# Patient Record
Sex: Female | Born: 1965 | Race: Black or African American | Hispanic: No | Marital: Single | Smoking: Current every day smoker
Health system: Southern US, Community
[De-identification: ages and names within clinical notes are randomized; demographics above are authoritative.]

## PROBLEM LIST (undated history)

## (undated) DIAGNOSIS — M545 Low back pain, unspecified: Secondary | ICD-10-CM

## (undated) DIAGNOSIS — I10 Essential (primary) hypertension: Secondary | ICD-10-CM

## (undated) DIAGNOSIS — F329 Major depressive disorder, single episode, unspecified: Secondary | ICD-10-CM

## (undated) DIAGNOSIS — F32A Depression, unspecified: Secondary | ICD-10-CM

## (undated) DIAGNOSIS — G5600 Carpal tunnel syndrome, unspecified upper limb: Secondary | ICD-10-CM

## (undated) HISTORY — DX: Low back pain, unspecified: M54.50

## (undated) HISTORY — DX: Major depressive disorder, single episode, unspecified: F32.9

## (undated) HISTORY — PX: KNEE SURGERY: SHX244

## (undated) HISTORY — PX: BREAST BIOPSY: SHX20

## (undated) HISTORY — PX: OTHER SURGICAL HISTORY: SHX169

## (undated) HISTORY — DX: Depression, unspecified: F32.A

## (undated) HISTORY — DX: Low back pain: M54.5

## (undated) HISTORY — DX: Carpal tunnel syndrome, unspecified upper limb: G56.00

---

## 2005-02-06 HISTORY — PX: ABDOMINAL HYSTERECTOMY: SHX81

## 2013-02-21 ENCOUNTER — Emergency Department (HOSPITAL_BASED_OUTPATIENT_CLINIC_OR_DEPARTMENT_OTHER)
Admission: EM | Admit: 2013-02-21 | Discharge: 2013-02-21 | Disposition: A | Discharge status: Home / Self Care | Payer: Medicaid Other | Attending: Emergency Medicine | Admitting: Emergency Medicine

## 2013-02-21 ENCOUNTER — Encounter (HOSPITAL_BASED_OUTPATIENT_CLINIC_OR_DEPARTMENT_OTHER): Payer: Self-pay | Admitting: Emergency Medicine

## 2013-02-21 ENCOUNTER — Emergency Department (HOSPITAL_BASED_OUTPATIENT_CLINIC_OR_DEPARTMENT_OTHER): Payer: Medicaid Other

## 2013-02-21 DIAGNOSIS — R079 Chest pain, unspecified: Secondary | ICD-10-CM

## 2013-02-21 DIAGNOSIS — I10 Essential (primary) hypertension: Secondary | ICD-10-CM | POA: Insufficient documentation

## 2013-02-21 DIAGNOSIS — R1110 Vomiting, unspecified: Secondary | ICD-10-CM | POA: Insufficient documentation

## 2013-02-21 DIAGNOSIS — R42 Dizziness and giddiness: Secondary | ICD-10-CM | POA: Insufficient documentation

## 2013-02-21 DIAGNOSIS — R0789 Other chest pain: Secondary | ICD-10-CM | POA: Insufficient documentation

## 2013-02-21 DIAGNOSIS — F17200 Nicotine dependence, unspecified, uncomplicated: Secondary | ICD-10-CM | POA: Insufficient documentation

## 2013-02-21 HISTORY — DX: Essential (primary) hypertension: I10

## 2013-02-21 LAB — CBC WITH DIFFERENTIAL/PLATELET
BASOS ABS: 0 10*3/uL (ref 0.0–0.1)
Basophils Relative: 0 % (ref 0–1)
Eosinophils Absolute: 0.2 10*3/uL (ref 0.0–0.7)
Eosinophils Relative: 2 % (ref 0–5)
HEMATOCRIT: 41.6 % (ref 36.0–46.0)
Hemoglobin: 14.6 g/dL (ref 12.0–15.0)
LYMPHS ABS: 4.1 10*3/uL — AB (ref 0.7–4.0)
Lymphocytes Relative: 42 % (ref 12–46)
MCH: 29.5 pg (ref 26.0–34.0)
MCHC: 35.1 g/dL (ref 30.0–36.0)
MCV: 84 fL (ref 78.0–100.0)
Monocytes Absolute: 0.8 10*3/uL (ref 0.1–1.0)
Monocytes Relative: 9 % (ref 3–12)
NEUTROS ABS: 4.6 10*3/uL (ref 1.7–7.7)
Neutrophils Relative %: 47 % (ref 43–77)
PLATELETS: 280 10*3/uL (ref 150–400)
RBC: 4.95 MIL/uL (ref 3.87–5.11)
RDW: 15.2 % (ref 11.5–15.5)
WBC: 9.7 10*3/uL (ref 4.0–10.5)

## 2013-02-21 LAB — BASIC METABOLIC PANEL
BUN: 6 mg/dL (ref 6–23)
CHLORIDE: 105 meq/L (ref 96–112)
CO2: 24 meq/L (ref 19–32)
Calcium: 9.1 mg/dL (ref 8.4–10.5)
Creatinine, Ser: 0.9 mg/dL (ref 0.50–1.10)
GFR calc Af Amer: 86 mL/min — ABNORMAL LOW (ref >90)
GFR calc non Af Amer: 74 mL/min — ABNORMAL LOW (ref >90)
GLUCOSE: 102 mg/dL — AB (ref 70–99)
Potassium: 3.3 mEq/L — ABNORMAL LOW (ref 3.7–5.3)
SODIUM: 142 meq/L (ref 137–147)

## 2013-02-21 LAB — TROPONIN I

## 2013-02-21 MED ORDER — LISINOPRIL 20 MG PO TABS: 20.0000 mg | ORAL_TABLET | Freq: Every day | ORAL | Status: DC

## 2013-02-21 MED ORDER — HYDROCHLOROTHIAZIDE 25 MG PO TABS: 25.0000 mg | ORAL_TABLET | Freq: Every day | ORAL | Status: DC

## 2013-02-21 NOTE — ED Notes (Signed)
Headache, dizzy, nausea, vomiting. Pain in her chest into her left arm x 3 days.

## 2013-02-21 NOTE — Discharge Instructions (Signed)
YOu need to find a primary care doctor! Emergency Department Resource Guide 1) Find a Doctor and Pay Out of Pocket Although you won't have to find out who is covered by your insurance plan, it is a good idea to ask around and get recommendations. You will then need to call the office and see if the doctor you have chosen will accept you as a new patient and what types of options they offer for patients who are self-pay. Some doctors offer discounts or will set up payment plans for their patients who do not have insurance, but you will need to ask so you aren't surprised when you get to your appointment.  2) Contact Your Local Health Department Not all health departments have doctors that can see patients for sick visits, but many do, so it is worth a call to see if yours does. If you don't know where your local health department is, you can check in your phone book. The CDC also has a tool to help you locate your state's health department, and many state websites also have listings of all of their local health departments.  3) Find a Walk-in Clinic If your illness is not likely to be very severe or complicated, you may want to try a walk in clinic. These are popping up all over the country in pharmacies, drugstores, and shopping centers. They're usually staffed by nurse practitioners or physician assistants that have been trained to treat common illnesses and complaints. They're usually fairly quick and inexpensive. However, if you have serious medical issues or chronic medical problems, these are probably not your best option.  No Primary Care Doctor: - Call Health Connect at  [phone] - they can help you locate a primary care doctor that  accepts your insurance, provides certain services, etc. - Physician Referral Service- [phone]  Chronic Pain Problems: Organization         Address  Phone   Notes  [facility]  [phone] Patients need to be referred by their primary  care doctor.   Medication Assistance: Organization         Address  Phone   Notes  [facility] Medication [facility] [address] [place], [place] [address] [phone] --Must be a resident of [facility] -- Must have NO insurance coverage whatsoever (no Medicaid/ Medicare, etc.) -- The pt. MUST have a primary care doctor that directs their care regularly and follows them in the community   Med[facility]ist  [phone]   [employer]  [phone]    Agencies that provide inexpensive medical care: Organization         Address  Phone   Notes  [facility]  [phone]   [facility]    [phone]   [facility] [address] [place], [place] [address] [phone]   [facility] [address], [place] [phone]   Planned Parenthood    [phone]   [facility]    [phone]   [facility]  [address], [place] Phone:  [phone], Fax:  [phone] Hours of Operation:  9 am - 6 pm, M-F.  Also accepts Medicaid/Medicare and self-pay.  [facility]  [address], [place] Phone: [phone], Fax: [phone]. Hours of Operation:  8:30 am - 5:30 pm, M-F.  Also accepts  Medicaid and self-pay.  [facility] [place] [address], [place] Phone: [phone]   [facility] [address] Natasha Bence [place], [place] [phone], Ext. [phone] Mondays & Thursdays: 7-9 AM.  First 15 patients are seen on a first come, first serve basis.    Medicaid-accepting [facility] Providers:  Organization         Address  Phone   Notes  [facility] [address], [place] [phone] Also accepts self-pay patients.  [facility] [address] Laurell Josephs [place], [place]  [phone]   [facility] [address], [place] [phone]   [facility] [address] [place] [phone]   Renaye Rakers [address], [place]   [phone] Only accepts [place] patients after they have their name applied to their card.   Self-Pay (no insurance) in [facility]:  Organization         Address  Phone   Notes  Sickle Cell Patients, [facility] [address] [place], [place] [phone]   [facility] [address] [place], [place] [phone]   [facility]  [address], [place] [phone]   [facility]/Dr. Osei-Bonsu  [address], [place] or [address], [place] [phone] Phone number for both [place] and [place] locations is the same.  [facility] [address], [place] [phone]   [facility] [address], [place] or [address] [phone]   [facility] [address], [place] [phone], phone; [phone], fax Sees patients 1st and 3rd Saturday of every month.  Must not qualify for public or private insurance (i.e. Medicaid, Medicare, [place] Health Choice, Veterans' Benefits)  Household income should be no more than 200% of the poverty level The clinic cannot treat you if you are pregnant or think you are pregnant  Sexually transmitted diseases are not treated at the clinic.    Dental Care: Organization         Address  Phone  Notes  [facility] [address] [place], [place] [phone] Accepts children up to age 57 who are enrolled in [place] or [place] Health Choice; pregnant women with a Medicaid card; and children who have applied for Medicaid or [place] Health Choice, but were declined, whose parents can pay a reduced fee at  time of service.  [facility] Department of [facility]  [address], [place] [phone] Accepts children up to age 32 who are enrolled in [place] or [place] Health Choice; pregnant women with a Medicaid card; and children who have applied for Medicaid or [place] Health Choice, but were declined, whose parents can pay a reduced fee at time of service.  [facility]  [address] [place], [place] [phone] Patients are seen by appointment only. Walk-ins are not accepted. [facility] will see patients 65 years of age and older. Monday - Tuesday (8am-5pm) Most Wednesdays (8:30-5pm) $30 per visit, cash only  [facility] Adult [facility]  [address], [facility] [phone] Patients are seen by appointment only. Walk-ins are not accepted. [facility] will see  patients 50 years of age and older. One Wednesday Evening (Monthly: Volunteer Based).  $30 per visit, cash only  [employer] [facility]  [phone] for adults; Children under age 404, call Graduate Pediatric Dentistry at [phone]. Children aged 274-14, please call [phone] to request a pediatric application.  Dental services are provided in all areas of dental care including fillings, crowns and bridges, complete and partial dentures, implants, gum treatment, root canals, and extractions. Preventive care is also provided. Treatment is provided to both adults and children. Patients are selected via a lottery and there is often a waiting list.   [facility] [address], [place]  [phone] [email or web address]   [facility] [address], [place], [place] [phone], Ext. [phone] Second and Fourth Thursday of each month, opens at 6:30 AM; Clinic ends at 9 AM.  Patients are seen on a first-come first-served basis, and a limited number are seen during each clinic.   [facility]  [address], [place], [place] [phone]) [phone]   Eligibility Requirements You must have lived in [place], [place], or [place] counties for at least the last three months.   You cannot be eligible for state or federal sponsored National Cityhealthcare insurance, including CIGNAVeterans Administration, [place]icaid, or [employer]icare.   You generally cannot be eligible for healthcare insurance through your employer.    How to apply: Eligibility screenings are held every Tuesday and Wednesday afternoon from 1:00 pm until 4:00 pm. You do not need an appointment for the interview!  [facility] [address], [place], [place] [phone]   [facility]  [phone]   [facility]  [phone]   [facility]  [phone]    Behavioral Health Resources in the Community: Intensive Outpatient Programs Organization         Address  Phone  Notes  [facility] [address], [place], [place] [phone]   [facility] [address], [place], [place] [phone]   [facility] [address], [place], [place]  [phone]   [facility] [address],  [place], [place] [phone] or [phone]   Substance Abuse Resources Organization         Address  Phone  Notes  [facility]  [phone]   [facility]  [phone]   [facility]  [phone]   [facility]  [phone]   Residential & Outpatient Substance Abuse Program  [phone]   Psychological Services Organization         Address  Phone  Notes  [facility]  [phone]- [phone]   [facility]  [phone]- [phone]   [facility] [address], [place] [phone] or [phone]    Mobile Crisis Teams Organization         Address  Phone  Notes  [facility], Mobile Crisis Care Unit  [phone]   [facility]  [facility]  [address] [place], [place] [phone]   Doristine LocksSharon DeEsch [address] [place] [phone]    Self-Help/Support Groups Organization         Address  Phone             Notes  [facility] [place] - variety of support groups  [phone] Call for more information  Narcotics Anonymous (NA), [facility] [address], [place]  2 meetings at this location   Residential Treatment Programs Organization         Address  Phone  Notes  [facility] [address],    [place]  [phone]   [facility]  [address], [place] [phone], [place], [place] [phone]   [facility] [address], [place] [phone] Admissions: 8am-3pm M-F  [facility] [address]    [place], [place] [phone]   [facility] [address], [place], [place] [phone]   [facility] [address]  [place], [place] [phone]   [facility] - Intensive Outpatient [address], [place], [place] [phone]   [facility] ([facility] [address]  [place], [place] [phone] or [phone]   [facility] ([facility]) [address] [place], [place] [phone] Accepts Medicaid  [facility] [address]  [place] [phone] Substance Abuse/Addiction Treatment   [facility] Resources Organization         Address  Phone  Notes  [facility]  [phone]) [phone]   Angie FavaJulie Brannon, PhD [address], [address] [place], [place]   [phone] or [phone]   [facility]   [address] [place], [place] [phone]) [phone]   [facility] [address], [place], [place] [phone](336) [phone] Insurance/Medicaid/sponsorship through [facility]  [facility] [address]                                    [place], [place] [phone])  [phone] Therapy/tele-psych/case  [facility] [address]   [place], [place] [phone]    Dr. Lolly MustacheArfeen  [phone]   [facility]  [employer] [facility] 1) [address], [place] 2) [address], [place] 3)  [address], [place] [phone](336) [phone]  [phone]   [facility] Child Abuse Hotline [phone] or [phone](336) [phone] (After Hours)      Chest Pain (Nonspecific) Chest pain has many causes. Your pain could be caused by something serious, such as a heart attack or a blood clot in the lungs. It could also be caused by something less serious, such as a chest bruise or a virus. Follow up with your doctor. More lab tests or other studies may be needed to find the cause of your pain. Most of the time, nonspecific chest pain will improve within 2 to 3 days of rest and mild pain medicine. HOME CARE  For chest bruises, you may put ice on the sore area for 15-20 minutes, 03-04 times a day. Do this only if it makes you feel better.  Put ice in a plastic bag.  Place a towel between the skin and the bag.  Rest for the next 2 to 3 days.  Go back to work if the pain improves.  See your doctor if the pain lasts longer than 1 to 2 weeks.  Only take medicine as told by your doctor.  Quit smoking if you smoke. GET HELP RIGHT AWAY IF:   There is more pain or pain that spreads to the arm, neck, jaw, back, or belly (abdomen).  You have shortness of breath.  You cough more than usual or cough up blood.  You have  very bad back or belly pain, feel sick to your stomach (nauseous), or throw up (vomit).  You have very bad weakness.  You pass out (faint).  You have a fever. Any of these problems may be serious and may be an emergency. Do not wait to see if the problems will go away. Get medical help right away. Call your local emergency services 911 in [place].. Do not drive yourself to the hospital. MAKE SURE  YOU:   Understand these instructions.  Will watch this condition.  Will get help right away if you or your child is not doing well or gets worse. Document Released: 07/12/2007 Document Revised: 04/17/2011 Document Reviewed: 07/12/2007 [facility] Patient Information 2014 [place], [place].

## 2013-02-21 NOTE — ED Provider Notes (Signed)
CSN: [id number]     Arrival date & time 02/21/13  1707 History   First MD Initiated Contact with Patient 02/21/13 1723     Chief Complaint  Patient presents with  . Chest Pain   (Consider location/radiation/quality/duration/timing/severity/associated sxs/prior Treatment) HPI Comments: Pt states that she has had dizziness with the cp and she vomited 3 times today. Pt state that she is supposed to be on bp medication but she has not been taking it for a year. Cp free at this time  Patient is a 48 y.o. female presenting with chest pain. The history is provided by the patient. No language interpreter was used.  Chest Pain Pain location:  Substernal area Pain quality: sharp   Pain radiates to:  L arm Pain radiates to the back: no   Pain severity:  Mild Onset quality:  Gradual Duration:  1 week Timing:  Intermittent Progression:  Unchanged Context: stress   Relieved by:  Nothing Worsened by:  Deep breathing Ineffective treatments:  None tried Associated symptoms: dizziness   Associated symptoms: no fever     Past Medical History  Diagnosis Date  . Hypertension    Past Surgical History  Procedure Laterality Date  . Abdominal hysterectomy    . Knee surgery     No family history on file. History  Substance Use Topics  . Smoking status: Current Every Day Smoker -- 0.50 packs/day    Types: Cigarettes  . Smokeless tobacco: Not on file  . Alcohol Use: No   OB History   Grav Para Term Preterm Abortions TAB SAB Ect Mult Living                 Review of Systems  Constitutional: Negative for fever.  Respiratory: Negative.   Cardiovascular: Positive for chest pain.  Neurological: Positive for dizziness.    Allergies  Review of patient's allergies indicates no known allergies.  Home Medications  No current outpatient prescriptions on file. BP 172/99  Pulse 80  Temp(Src) 98.6 F (37 C) (Oral)  Resp 18  Ht 5\' 2"  (1.575 m)  Wt 185 lb (83.915 kg)  BMI 33.83 kg/m2  SpO2  99% Physical Exam  Nursing note and vitals reviewed. Constitutional: She is oriented to person, place, and time. She appears well-developed and well-nourished.  HENT:  Head: Normocephalic and atraumatic.  Cardiovascular: Normal rate and regular rhythm.   Pulmonary/Chest: Effort normal and breath sounds normal. She exhibits no tenderness.  Abdominal: Soft. Bowel sounds are normal.  Musculoskeletal: Normal range of motion.  Neurological: She is alert and oriented to person, place, and time.  Skin: Skin is warm and dry.  Psychiatric: She has a normal mood and affect.    ED Course  Procedures (including critical care time) Labs Review Labs Reviewed  CBC WITH DIFFERENTIAL - Abnormal; Notable for the following:    Lymphs Abs 4.1 (*)    All other components within normal limits  BASIC METABOLIC PANEL  TROPONIN I   Imaging Review Dg Chest 2 View  02/21/2013   CLINICAL DATA:  Chest pain  EXAM: CHEST  2 VIEW  COMPARISON:  None.  FINDINGS: The heart size and mediastinal contours are within normal limits. Both lungs are clear. The visualized skeletal structures are unremarkable.  IMPRESSION: No active cardiopulmonary disease.   Electronically Signed   By: Salome Holmes M.D.   On: 02/21/2013 17:55   Ct Head Wo Contrast  02/21/2013   CLINICAL DATA:  Dizziness and headache with history of hypertension  EXAM: CT HEAD WITHOUT CONTRAST  TECHNIQUE: Contiguous axial images were obtained from the base of the skull through the vertex without intravenous contrast.  COMPARISON:  None.  FINDINGS: The ventricles are normal in size and position. There is no intracranial hemorrhage nor intracranial mass effect. There is no evidence of an evolving ischemic infarction. The cerebellum and brainstem are normal in density.  At bone window settings the observed portions of the paranasal sinuses and mastoid air cells are clear. There is no evidence of an acute skull fracture.  IMPRESSION: There is no evidence of an  acute ischemic or hemorrhagic event or other acute intracranial abnormality.   Electronically Signed   By: David  [place]   On: 02/21/2013 17:54    EKG Interpretation    Date/Time:  Friday February 21 2013 17:17:28 EST Ventricular Rate:  74 PR Interval:  156 QRS Duration: 74 QT Interval:  420 QTC Calculation: 466 R Axis:   68 Text Interpretation:  Normal sinus rhythm Normal ECG Confirmed by BEATON  MD, ROBERT ([id number]) on 02/21/2013 5:20:39 PM            MDM   1. Chest pain   2. Hypertension    Pt has been having pain for a week;troponin is negative:think that she need to start bp medications again and given resource guide:pt tearful and has been under a lot of stress:so think anxiety a component of this   Teressa LowerVrinda Hagen Bohorquez, NP 02/21/13 1954

## 2013-02-26 NOTE — ED Provider Notes (Signed)
Medical screening examination/treatment/procedure(s) were performed by non-physician practitioner and as supervising physician I was immediately available for consultation/collaboration.    Samary Shatz L Delaine Hernandez, MD 02/26/13 0807 

## 2013-08-22 ENCOUNTER — Ambulatory Visit: Payer: Medicaid Other | Admitting: Family Medicine

## 2013-09-05 ENCOUNTER — Ambulatory Visit: Payer: Medicaid Other | Admitting: Family Medicine

## 2013-09-09 ENCOUNTER — Telehealth: Payer: Self-pay

## 2013-09-09 NOTE — Telephone Encounter (Signed)
New patient  [place]. Has copies of her records and plans to bring them with her during her office visit.     Medication and allergies:  Reviewed and updated  90 day supply/mail order: n/a Local pharmacy:  [id number] [place] - [address]OSS [address]   Immunizations due:  tdap   A/P: Personal, family history and past surgical hx: Reviewed and updated PAP- no longer receives since hysterectomy [facility]- 2012- cyst on L breast otw normal Flu- DUE Tdap- unsure of when she last received   To Discuss with Provider: Nothing at this time.

## 2013-09-10 ENCOUNTER — Telehealth: Payer: Self-pay | Admitting: Family Medicine

## 2013-09-10 ENCOUNTER — Encounter: Payer: Self-pay | Admitting: Family Medicine

## 2013-09-10 ENCOUNTER — Ambulatory Visit (INDEPENDENT_AMBULATORY_CARE_PROVIDER_SITE_OTHER): Payer: Medicaid Other | Admitting: Family Medicine

## 2013-09-10 VITALS — BP 160/98 | HR 72 | Temp 97.7°F | Ht 63.0 in | Wt 171.2 lb

## 2013-09-10 DIAGNOSIS — G8929 Other chronic pain: Secondary | ICD-10-CM

## 2013-09-10 DIAGNOSIS — R0789 Other chest pain: Secondary | ICD-10-CM

## 2013-09-10 DIAGNOSIS — I10 Essential (primary) hypertension: Secondary | ICD-10-CM

## 2013-09-10 DIAGNOSIS — N390 Urinary tract infection, site not specified: Secondary | ICD-10-CM

## 2013-09-10 DIAGNOSIS — M549 Dorsalgia, unspecified: Secondary | ICD-10-CM

## 2013-09-10 MED ORDER — NONFORMULARY OR COMPOUNDED ITEM: Status: DC

## 2013-09-10 MED ORDER — LISINOPRIL-HYDROCHLOROTHIAZIDE 20-25 MG PO TABS: 1.0000 | ORAL_TABLET | Freq: Every day | ORAL | Status: DC

## 2013-09-10 MED ORDER — TIZANIDINE HCL 4 MG PO CAPS: 4.0000 mg | ORAL_CAPSULE | Freq: Three times a day (TID) | ORAL | Status: DC | PRN

## 2013-09-10 MED ORDER — HYDROCODONE-ACETAMINOPHEN 5-325 MG PO TABS: 1.0000 | ORAL_TABLET | Freq: Four times a day (QID) | ORAL | Status: DC | PRN

## 2013-09-10 MED ORDER — TRAMADOL HCL 50 MG PO TABS: 50.0000 mg | ORAL_TABLET | Freq: Three times a day (TID) | ORAL | Status: DC | PRN

## 2013-09-10 NOTE — Progress Notes (Signed)
Pre visit review using our clinic review tool, if applicable. No additional management support is needed unless otherwise documented below in the visit note. 
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
## 2013-09-10 NOTE — Telephone Encounter (Signed)
Relevant patient education mailed to patient.  

## 2013-09-10 NOTE — Patient Instructions (Signed)

## 2013-09-11 ENCOUNTER — Telehealth: Payer: Self-pay | Admitting: Family Medicine

## 2013-09-11 NOTE — Telephone Encounter (Signed)
Relevant patient education mailed to patient.  

## 2013-09-23 ENCOUNTER — Telehealth: Payer: Self-pay | Admitting: *Deleted

## 2013-09-23 ENCOUNTER — Other Ambulatory Visit: Payer: Self-pay | Admitting: *Deleted

## 2013-09-23 MED ORDER — TIZANIDINE HCL 4 MG PO TABS: 4.0000 mg | ORAL_TABLET | Freq: Four times a day (QID) | ORAL | Status: DC | PRN

## 2013-09-23 NOTE — Telephone Encounter (Signed)
Prior auth - tizanidine hcl 4mg . Approved only with tablets not capsules.

## 2013-09-25 ENCOUNTER — Encounter: Payer: Self-pay | Admitting: Family Medicine

## 2013-09-25 ENCOUNTER — Ambulatory Visit: Payer: Medicaid Other | Admitting: Family Medicine

## 2013-09-25 ENCOUNTER — Ambulatory Visit (INDEPENDENT_AMBULATORY_CARE_PROVIDER_SITE_OTHER): Payer: Medicaid Other | Admitting: Family Medicine

## 2013-09-25 VITALS — BP 148/96 | HR 89 | Temp 98.3°F | Wt 169.0 lb

## 2013-09-25 DIAGNOSIS — Z23 Encounter for immunization: Secondary | ICD-10-CM

## 2013-09-25 DIAGNOSIS — I10 Essential (primary) hypertension: Secondary | ICD-10-CM

## 2013-09-25 DIAGNOSIS — R51 Headache: Secondary | ICD-10-CM

## 2013-09-25 MED ORDER — NONFORMULARY OR COMPOUNDED ITEM: Status: DC

## 2013-09-25 MED ORDER — AMLODIPINE BESYLATE 5 MG PO TABS: 5.0000 mg | ORAL_TABLET | Freq: Every day | ORAL | Status: DC

## 2013-09-25 NOTE — Progress Notes (Signed)
Pre visit review using our clinic review tool, if applicable. No additional management support is needed unless otherwise documented below in the visit note. 
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
## 2013-09-25 NOTE — Patient Instructions (Signed)

## 2013-10-06 ENCOUNTER — Telehealth: Payer: Self-pay

## 2013-10-06 DIAGNOSIS — G8929 Other chronic pain: Secondary | ICD-10-CM

## 2013-10-06 DIAGNOSIS — M549 Dorsalgia, unspecified: Principal | ICD-10-CM

## 2013-10-06 MED ORDER — TRAMADOL HCL 50 MG PO TABS: 50.0000 mg | ORAL_TABLET | Freq: Three times a day (TID) | ORAL | Status: DC | PRN

## 2013-10-06 MED ORDER — HYDROCODONE-ACETAMINOPHEN 5-325 MG PO TABS: 1.0000 | ORAL_TABLET | Freq: Four times a day (QID) | ORAL | Status: DC | PRN

## 2013-10-06 NOTE — Telephone Encounter (Signed)
Courtney Andrews [phone]  [name] called and he needs a prescription for HYDROcodone-acetaminophen (NORCO/VICODIN) 5-325 MG per tablet / traMADol (ULTRAM) 50 MG tablet, he wants to pick them up when he comes for his appointment on Wednesday.

## 2013-10-06 NOTE — Telephone Encounter (Signed)
Ok to refill both x 1  

## 2013-10-06 NOTE — Telephone Encounter (Signed)
Rx faxed.    KP 

## 2013-10-06 NOTE — Telephone Encounter (Signed)
Last seen 09/25/13 and filled 09/10/13 Both #30.   Please advise    KP

## 2013-10-07 ENCOUNTER — Telehealth: Payer: Self-pay | Admitting: Family Medicine

## 2013-10-07 NOTE — Telephone Encounter (Signed)
Patient has questions regarding her blood pressure medication? She wants to know if she should take the old bp med with the new med or should she discontinue taking the old medication?

## 2013-10-07 NOTE — Telephone Encounter (Signed)
Patient has been made aware and voiced understanding.     KP 

## 2013-10-07 NOTE — Telephone Encounter (Signed)
Take norvasc and lisinopril

## 2013-10-07 NOTE — Telephone Encounter (Signed)
Please advise if the patient is supposed to take both meds.      KP

## 2013-10-09 ENCOUNTER — Ambulatory Visit: Payer: Medicaid Other | Admitting: Family Medicine

## 2013-10-09 ENCOUNTER — Other Ambulatory Visit: Payer: Medicaid Other

## 2013-10-10 ENCOUNTER — Other Ambulatory Visit (INDEPENDENT_AMBULATORY_CARE_PROVIDER_SITE_OTHER): Payer: Medicaid Other

## 2013-10-10 DIAGNOSIS — Z0000 Encounter for general adult medical examination without abnormal findings: Secondary | ICD-10-CM

## 2013-10-10 DIAGNOSIS — I10 Essential (primary) hypertension: Secondary | ICD-10-CM

## 2013-10-10 LAB — POCT URINALYSIS DIPSTICK
BILIRUBIN UA: NEGATIVE
Blood, UA: NEGATIVE
GLUCOSE UA: NEGATIVE
Ketones, UA: NEGATIVE
Leukocytes, UA: NEGATIVE
Nitrite, UA: NEGATIVE
SPEC GRAV UA: 1.01
UROBILINOGEN UA: 0.2
pH, UA: 6.5

## 2013-10-10 LAB — CBC WITH DIFFERENTIAL/PLATELET
Basophils Absolute: 0 K/uL (ref 0.0–0.1)
Basophils Relative: 0.4 % (ref 0.0–3.0)
Eosinophils Absolute: 0.2 K/uL (ref 0.0–0.7)
Eosinophils Relative: 2.4 % (ref 0.0–5.0)
HCT: 39.3 % (ref 36.0–46.0)
Hemoglobin: 13.4 g/dL (ref 12.0–15.0)
Lymphocytes Relative: 46.9 % — ABNORMAL HIGH (ref 12.0–46.0)
Lymphs Abs: 4.2 K/uL — ABNORMAL HIGH (ref 0.7–4.0)
MCHC: 34.2 g/dL (ref 30.0–36.0)
MCV: 86.4 fl (ref 78.0–100.0)
Monocytes Absolute: 0.7 K/uL (ref 0.1–1.0)
Monocytes Relative: 8.3 % (ref 3.0–12.0)
Neutro Abs: 3.8 K/uL (ref 1.4–7.7)
Neutrophils Relative %: 42 % — ABNORMAL LOW (ref 43.0–77.0)
Platelets: 294 K/uL (ref 150.0–400.0)
RBC: 4.55 Mil/uL (ref 3.87–5.11)
RDW: 15.1 % (ref 11.5–15.5)
WBC: 9 K/uL (ref 4.0–10.5)

## 2013-10-10 LAB — BASIC METABOLIC PANEL
BUN: 14 mg/dL (ref 6–23)
CALCIUM: 9.1 mg/dL (ref 8.4–10.5)
CHLORIDE: 102 meq/L (ref 96–112)
CO2: 32 mEq/L (ref 19–32)
Creatinine, Ser: 1.1 mg/dL (ref 0.4–1.2)
GFR: 70.64 mL/min (ref >60.00)
GLUCOSE: 90 mg/dL (ref 70–99)
POTASSIUM: 3 meq/L — AB (ref 3.5–5.1)
SODIUM: 139 meq/L (ref 135–145)

## 2013-10-10 LAB — HEPATIC FUNCTION PANEL
ALT: 13 U/L (ref 0–35)
AST: 14 U/L (ref 0–37)
Albumin: 3.5 g/dL (ref 3.5–5.2)
Alkaline Phosphatase: 47 U/L (ref 39–117)
Bilirubin, Direct: 0 mg/dL (ref 0.0–0.3)
Total Bilirubin: 0.2 mg/dL (ref 0.2–1.2)
Total Protein: 7.3 g/dL (ref 6.0–8.3)

## 2013-10-10 LAB — LIPID PANEL
CHOL/HDL RATIO: 6
Cholesterol: 179 mg/dL (ref 0–200)
HDL: 30.6 mg/dL — ABNORMAL LOW (ref >39.00)
LDL Cholesterol: 122 mg/dL — ABNORMAL HIGH (ref 0–99)
NonHDL: 148.4
TRIGLYCERIDES: 134 mg/dL (ref 0.0–149.0)
VLDL: 26.8 mg/dL (ref 0.0–40.0)

## 2013-10-10 LAB — TSH: TSH: 0.32 u[IU]/mL — ABNORMAL LOW (ref 0.35–4.50)

## 2013-10-15 MED ORDER — POTASSIUM CHLORIDE CRYS ER 20 MEQ PO TBCR: 20.0000 meq | EXTENDED_RELEASE_TABLET | Freq: Every day | ORAL | Status: DC

## 2013-10-21 ENCOUNTER — Telehealth: Payer: Self-pay

## 2013-10-21 DIAGNOSIS — M549 Dorsalgia, unspecified: Principal | ICD-10-CM

## 2013-10-21 DIAGNOSIS — G8929 Other chronic pain: Secondary | ICD-10-CM

## 2013-10-21 MED ORDER — HYDROCODONE-ACETAMINOPHEN 5-325 MG PO TABS: 1.0000 | ORAL_TABLET | Freq: Four times a day (QID) | ORAL | Status: DC | PRN

## 2013-10-21 NOTE — Telephone Encounter (Signed)
Refill x1 

## 2013-10-21 NOTE — Telephone Encounter (Signed)
Last seen 09/25/13 and filled 10/06/13 #30.  Please advise      KP

## 2013-10-21 NOTE — Telephone Encounter (Signed)
Patient aware Rx ready for pick up.      KP 

## 2013-10-21 NOTE — Telephone Encounter (Signed)
Courtney Andrews [phone]  Mathea called to see if he was suppose to call every 15 days or every 30 days for his HYDROcodone-acetaminophen (NORCO/VICODIN) 5-325 MG per tablet, he is out.

## 2013-11-06 ENCOUNTER — Telehealth: Payer: Self-pay | Admitting: Family Medicine

## 2013-11-06 DIAGNOSIS — M549 Dorsalgia, unspecified: Principal | ICD-10-CM

## 2013-11-06 DIAGNOSIS — G8929 Other chronic pain: Secondary | ICD-10-CM

## 2013-11-06 MED ORDER — TRAMADOL HCL 50 MG PO TABS: 50.0000 mg | ORAL_TABLET | Freq: Three times a day (TID) | ORAL | Status: DC | PRN

## 2013-11-06 NOTE — Telephone Encounter (Signed)
Rx faxed.    KP 

## 2013-11-06 NOTE — Telephone Encounter (Signed)
Caller name: Kylena Relation to pt: Call back [phone]   Reason for call:  Pt is requesting a refill on Rx traMADol (ULTRAM) 50 MG tablet .  Thanks.

## 2013-11-06 NOTE — Telephone Encounter (Signed)
Refill x1 

## 2013-11-06 NOTE — Telephone Encounter (Signed)
Last seen 09/25/13 and filled 10/06/13 #60. No UDS and No contract   Please advise     KP

## 2013-11-14 ENCOUNTER — Telehealth: Payer: Self-pay | Admitting: Family Medicine

## 2013-11-14 DIAGNOSIS — E876 Hypokalemia: Secondary | ICD-10-CM

## 2013-11-14 DIAGNOSIS — G8929 Other chronic pain: Secondary | ICD-10-CM

## 2013-11-14 DIAGNOSIS — M549 Dorsalgia, unspecified: Principal | ICD-10-CM

## 2013-11-14 NOTE — Telephone Encounter (Signed)
Out office. Got to this after hours on Friday. Can you ask other.

## 2013-11-14 NOTE — Telephone Encounter (Signed)
Caller name: Aryel Relation to pt: Call back number:[phone]  Reason for call:  Pt is requesting a refill on Rx HYDROcodone-acetaminophen (NORCO/VICODIN) 5-325 MG per

## 2013-11-14 NOTE — Telephone Encounter (Signed)
Last seen 09/25/13 and filled 10/21/13 #60. No UDS and No Contract.  Please advise    KP

## 2013-11-17 MED ORDER — HYDROCODONE-ACETAMINOPHEN 5-325 MG PO TABS: 1.0000 | ORAL_TABLET | Freq: Four times a day (QID) | ORAL | Status: DC | PRN

## 2013-11-17 MED ORDER — POTASSIUM CHLORIDE CRYS ER 20 MEQ PO TBCR: 20.0000 meq | EXTENDED_RELEASE_TABLET | Freq: Every day | ORAL | Status: DC

## 2013-11-17 NOTE — Telephone Encounter (Signed)
Spoke with patient and made her aware her Rx is ready for pick up. She is also due to have her Potassium rechecked. I placed the order and advised her to have it done when she goes to the lab today, and she voiced understanding and agreed.      KP

## 2013-11-17 NOTE — Telephone Encounter (Signed)
To Dr.Tabori to advise      KP

## 2013-11-17 NOTE — Telephone Encounter (Signed)
Ok for #30, no refills.  Needs contract and UDS

## 2013-11-27 ENCOUNTER — Other Ambulatory Visit: Payer: Self-pay | Admitting: Family Medicine

## 2013-12-02 ENCOUNTER — Emergency Department (HOSPITAL_BASED_OUTPATIENT_CLINIC_OR_DEPARTMENT_OTHER)
Admission: EM | Admit: 2013-12-02 | Discharge: 2013-12-02 | Disposition: A | Discharge status: Home / Self Care | Payer: Medicaid Other | Attending: Emergency Medicine | Admitting: Emergency Medicine

## 2013-12-02 ENCOUNTER — Encounter (HOSPITAL_BASED_OUTPATIENT_CLINIC_OR_DEPARTMENT_OTHER): Payer: Self-pay | Admitting: Emergency Medicine

## 2013-12-02 DIAGNOSIS — Z87828 Personal history of other (healed) physical injury and trauma: Secondary | ICD-10-CM | POA: Insufficient documentation

## 2013-12-02 DIAGNOSIS — K088 Other specified disorders of teeth and supporting structures: Secondary | ICD-10-CM | POA: Diagnosis present

## 2013-12-02 DIAGNOSIS — I10 Essential (primary) hypertension: Secondary | ICD-10-CM | POA: Diagnosis not present

## 2013-12-02 DIAGNOSIS — K047 Periapical abscess without sinus: Secondary | ICD-10-CM | POA: Diagnosis not present

## 2013-12-02 DIAGNOSIS — Z79899 Other long term (current) drug therapy: Secondary | ICD-10-CM | POA: Diagnosis not present

## 2013-12-02 DIAGNOSIS — F329 Major depressive disorder, single episode, unspecified: Secondary | ICD-10-CM | POA: Diagnosis not present

## 2013-12-02 DIAGNOSIS — Z8669 Personal history of other diseases of the nervous system and sense organs: Secondary | ICD-10-CM | POA: Insufficient documentation

## 2013-12-02 DIAGNOSIS — Z720 Tobacco use: Secondary | ICD-10-CM | POA: Diagnosis not present

## 2013-12-02 MED ORDER — AMOXICILLIN 500 MG PO CAPS: 500.0000 mg | ORAL_CAPSULE | Freq: Three times a day (TID) | ORAL | Status: DC

## 2013-12-02 MED ORDER — ACETAMINOPHEN-CODEINE #3 300-30 MG PO TABS: 1.0000 | ORAL_TABLET | Freq: Four times a day (QID) | ORAL | Status: DC | PRN

## 2013-12-02 NOTE — ED Notes (Signed)
Abscess to her gum since yesterday. No relief with ASA.
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
## 2013-12-02 NOTE — Discharge Instructions (Signed)
Take antibiotic to completion. Take tylenol #3 for severe pain only. No driving or operating heavy machinery while taking tylenol #3. This medication may cause drowsiness.  Dental Abscess A dental abscess is a collection of infected fluid (pus) from a bacterial infection in the inner part of the tooth (pulp). It usually occurs at the end of the tooth's root.  CAUSES   Severe tooth decay.  Trauma to the tooth that allows bacteria to enter into the pulp, such as a broken or chipped tooth. SYMPTOMS   Severe pain in and around the infected tooth.  Swelling and redness around the abscessed tooth or in the mouth or face.  Tenderness.  Pus drainage.  Bad breath.  Bitter taste in the mouth.  Difficulty swallowing.  Difficulty opening the mouth.  Nausea.  Vomiting.  Chills.  Swollen neck glands. DIAGNOSIS   A medical and dental history will be taken.  An examination will be performed by tapping on the abscessed tooth.  X-rays may be taken of the tooth to identify the abscess. TREATMENT The goal of treatment is to eliminate the infection. You may be prescribed antibiotic medicine to stop the infection from spreading. A root canal may be performed to save the tooth. If the tooth cannot be saved, it may be pulled (extracted) and the abscess may be drained.  HOME CARE INSTRUCTIONS  Only take over-the-counter or prescription medicines for pain, fever, or discomfort as directed by your caregiver.  Rinse your mouth (gargle) often with salt water ( tsp salt in 8 oz [250 ml] of warm water) to relieve pain or swelling.  Do not drive after taking pain medicine (narcotics).  Do not apply heat to the outside of your face.  Return to your dentist for further treatment as directed. SEEK MEDICAL CARE IF:  Your pain is not helped by medicine.  Your pain is getting worse instead of better. SEEK IMMEDIATE MEDICAL CARE IF:  You have a fever or persistent symptoms for more than 2-3  days.  You have a fever and your symptoms suddenly get worse.  You have chills or a very bad headache.  You have problems breathing or swallowing.  You have trouble opening your mouth.  You have swelling in the neck or around the eye. Document Released: 01/23/2005 Document Revised: 10/18/2011 Document Reviewed: 05/03/2010 [facility] Patient Information 2015 [place], [place]. This information is not intended to replace advice given to you by your health care provider. Make sure you discuss any questions you have with your health care provider.  Diet and Dental Disease What you eat affects the health of your teeth. Diet plays an important role in developing healthy teeth and preventing dental disease, such as:  Tooth decay.  Gum (periodontal) disease.  Developmental defects of the enamel. This is when visible surfaces of the tooth do not form properly, leaving the tooth more prone to decay.  Dental erosion. This is when the teeth wear away. Knowing which foods promote strong teeth and which foods to stay away from can help you prevent poor oral health. If your diet lacks proper nutrients, it may be difficult for the tissues in your mouth to prevent dental disease. FOODS THAT PROMOTE DENTAL DISEASE The following foods either contain acids or create acid in your mouth that increases the risk of tooth decay:  Sugary foods, such as candy and baked goods (cookies, cake).  Soft drinks (carbonated and non-carbonated) such as soda, sports drinks, and fruit juice.  Citrus fruits, such as oranges  and lemons.  Berries.  Honey.  Herbal teas that contain berries and other fruits.  Wines and other alcoholic beverages.  Vinegar or vinegar containing foods, such as pickles.  Starchy snacks such as crackers, potato chips, [place] fries, and pasta. Some of these foods have health benefits. Eat these foods in moderation. The more often you eat these foods, the more frequently you are exposing  your teeth to the acid that causes dental diseases. FOODS THAT REDUCE THE RISK OF DENTAL DISEASE Certain foods help to keep the teeth strong and reduce the risk of tooth decay. These foods include:  Dairy products, such as cow's milk and cheese. Eating dairy with a meal or sugary snack reduces the risk of tooth decay.  Gums and foods that substitute sugar with sorbitol, mannitol, and xylitol.  Fluoride containing foods, such as black tea. Fluoride is a natural mineral that protects the teeth from tooth decay. Your caregiver may recommend fluoride toothpaste or a fluoride supplement.  Breast milk. DIETARY RECOMMENDATIONS FOR HEALTHY TEETH  Eat a healthy, well-balanced diet with fiber-rich fruits and vegetables and quality proteins (eggs, meat, poultry, and fish). A variety of foods each day in moderation is best.  Avoid frequent sugary snacks in between meals.  Avoid frequent sticky, chewy, sugary candies, such as gummy bears and other candies that stick to the teeth. Avoid sucking on candies for a long time.  Avoid drinks that contain added sugar. Even though they do not sit in the mouth for very long, they can promote tooth decay if consumed too frequently.  Avoid sugary foods and drinks late at night.  Avoid swishing or holding acidic or sugary drinks in your mouth. Using a straw limits contact with the teeth.  If you like frequent sugary treats, try eating a sugary dessert after a meal or with a dairy product, rather than eating it by itself.  Avoid starchy foods such as graham crackers that stick to your teeth.  Eat highly acidic and sugary foods in moderation, especially if you tend to develop tooth decay. Eat citrus fruits or drinks 2 times per day or less. Limit foods with vinegar and sports drinks to 1 time per week.  Try rinsing your mouth with water after a sugary or acidic meal or drink. Rinsing may help to reduce the acid buildup in the mouth.  Limit alcohol.  Read  labels to determine the amount of sugar in foods. PRACTICE GOOD DAILY ORAL HYGIENE   Have your teeth professionally cleaned at the dentist every 6 months.  Brush twice daily with a fluoride toothpaste.  Floss between your teeth daily.  Ask your caregiver if you need fluoride supplements or treatments.  Ask your caregiver if you should have sealants applied to some of your teeth. HOME CARE INSTRUCTIONS  Follow the guidelines included here to promote good oral health.  Follow all of your caregiver's instructions for managing your health condition(s).  See your caregiver for follow-up exams as directed. Document Released: 09/21/2010 Document Revised: 04/17/2011 Document Reviewed: 09/21/2010 [facility] Patient Information 2015 [place], [place]. This information is not intended to replace advice given to you by your health care provider. Make sure you discuss any questions you have with your health care provider.  Periodontal Disease Periodontal disease, or gum disease, is a type of oral disease that affects the surrounding and supporting tissues of the teeth. These include the gums (gingivae), ligaments, and tooth socket (alveolar bone). Periodontal disease can affect one tooth or many teeth. If left  untreated, it may lead to tooth loss.  CAUSES The main cause of periodontal disease is dental plaque, which contains harmful bacteria. These bacteria can cause the gums to become inflamed and infected. Further progression of the disease can damage the other supporting tissues.  RISK FACTORS  Diabetes.   Smoking and tobacco use.   Genetics.   Hormonal changes of puberty, menopause, and pregnancy.   Stress.   Clenching or grinding your teeth.   Substance abuse.  Poor nutrition.   Diseases that interfere with the body's immune system.   Certain medicines. SIGNS AND SYMPTOMS  Red or swollen gums.  Bad breath that does not go away.  Gums that have pulled away from the  teeth.  Gums that bleed easily.  Permanent teeth that are loose or separating.  Pain when chewing.  Changes in the way your teeth fit together.  Sensitive teeth. DIAGNOSIS  A thorough examination of the periodontal tissues will be done by your dentist. X-rays may be needed. Evaluation of your medical history will be needed to see if there are other factors or underlying conditions that may contribute to the disease. TREATMENT The number and types of treatment will vary depending on the extent of the disease. Treatment may include brushing and flossing only. Further disease progression may necessitate scaling and root planing or even surgery. The main goal is to control the infection. Good oral hygiene at home is necessary for the success of all types of treatment. HOME CARE INSTRUCTIONS   Practice good oral hygiene. This includes flossing and brushing your teeth every day.   See your dentist regularly, at least 2 times per year.   Stop smoking if you smoke.  Eat a well-balanced diet. SEEK IMMEDIATE DENTAL CARE IF:   You have any signs or symptoms of periodontal disease along with:  Swelling of your face, neck, or jaw.  Inability to open your mouth.  Severe pain uncontrolled by pain medicine.  You have a fever or persistent symptoms for more than 2-3 days.  You have a fever and your symptoms suddenly get worse. Document Released: 01/26/2003 Document Revised: 09/25/2012 Document Reviewed: 07/02/2012 [facility] Patient Information 2015 [place], [place]. This information is not intended to replace advice given to you by your health care provider. Make sure you discuss any questions you have with your health care provider.

## 2013-12-03 NOTE — ED Provider Notes (Signed)
Medical screening examination/treatment/procedure(s) were performed by non-physician practitioner and as supervising physician I was immediately available for consultation/collaboration.   EKG Interpretation None        Jayzen Paver, MD 12/03/13 1438 

## 2013-12-08 ENCOUNTER — Telehealth: Payer: Self-pay

## 2013-12-08 ENCOUNTER — Telehealth: Payer: Self-pay | Admitting: Family Medicine

## 2013-12-08 DIAGNOSIS — M549 Dorsalgia, unspecified: Principal | ICD-10-CM

## 2013-12-08 DIAGNOSIS — G8929 Other chronic pain: Secondary | ICD-10-CM

## 2013-12-08 NOTE — Telephone Encounter (Signed)
Caller name: Micaila Relation to pt: self Call back number: [phone] Pharmacy:  Reason for call:   Patient is questioning the refusal to fill tramadol. She thought that she would be fine calling in today to get her med refilled but the pharmacy told her to call our office

## 2013-12-08 NOTE — Telephone Encounter (Signed)
  Last seen 09/25/13 and filled 11/06/13 #60 UDS +Marijuana   Please advise KP

## 2013-12-08 NOTE — Telephone Encounter (Signed)
Refill x1--- check uds If positive for marijuana we will not be able to write it anymore

## 2013-12-08 NOTE — Telephone Encounter (Signed)
We did her UDS last week and it was + for Marijuana. Did you want to repeat this soon?

## 2013-12-10 MED ORDER — TRAMADOL HCL 50 MG PO TABS: 50.0000 mg | ORAL_TABLET | Freq: Three times a day (TID) | ORAL | Status: DC | PRN

## 2013-12-10 NOTE — Telephone Encounter (Signed)
Rx Faxed for Tramadol.      KP

## 2013-12-10 NOTE — Addendum Note (Signed)
Addended by: Arnette NorrisPAYNE, Calven Gilkes P on: 12/10/2013 09:04 AM   Modules accepted: Orders

## 2013-12-10 NOTE — Telephone Encounter (Signed)
Patient calling back regarding this.  °

## 2013-12-15 ENCOUNTER — Telehealth: Payer: Self-pay | Admitting: Family Medicine

## 2013-12-15 NOTE — Telephone Encounter (Signed)
We need uds results

## 2013-12-15 NOTE — Telephone Encounter (Signed)
No refill-- pt got pain med from ER

## 2013-12-15 NOTE — Telephone Encounter (Signed)
Hydrocodone requested  Last seen 09/25/13 and filled 11/17/13 #30   UDS pending previous + for Marijuana   Please advise     KP

## 2013-12-15 NOTE — Telephone Encounter (Signed)
Caller name: Courtney Andrews Relation to pt: self Call back number: [phone] Pharmacy:  Reason for call:   Patient requesting a new vicoden rx

## 2013-12-15 NOTE — Telephone Encounter (Signed)
The results are not back yet, the urine samples were not picked up until Friday for her recheck.      KP

## 2013-12-16 NOTE — Telephone Encounter (Signed)
She was given Tylenol 3 #8 tablets on 12/02/13.  I made her aware we will not refill her medication at this time and she would need a repeat UDS. She voiced understanding.     KP

## 2013-12-26 ENCOUNTER — Ambulatory Visit: Payer: Medicaid Other | Admitting: Family Medicine

## 2013-12-29 ENCOUNTER — Encounter: Payer: Self-pay | Admitting: Family Medicine

## 2014-01-06 ENCOUNTER — Other Ambulatory Visit: Payer: Self-pay | Admitting: Family Medicine

## 2014-04-27 ENCOUNTER — Ambulatory Visit: Payer: Medicaid Other | Admitting: Family Medicine

## 2014-04-28 ENCOUNTER — Encounter: Payer: Self-pay | Admitting: Family Medicine

## 2014-04-28 ENCOUNTER — Ambulatory Visit (INDEPENDENT_AMBULATORY_CARE_PROVIDER_SITE_OTHER): Payer: Self-pay | Admitting: Family Medicine

## 2014-04-28 VITALS — BP 118/72 | HR 75 | Temp 98.3°F | Wt 169.2 lb

## 2014-04-28 DIAGNOSIS — S161XXA Strain of muscle, fascia and tendon at neck level, initial encounter: Secondary | ICD-10-CM

## 2014-04-28 MED ORDER — CYCLOBENZAPRINE HCL 10 MG PO TABS: 10.0000 mg | ORAL_TABLET | Freq: Three times a day (TID) | ORAL | Status: DC | PRN

## 2014-04-28 NOTE — Progress Notes (Signed)
Pre visit review using our clinic review tool, if applicable. No additional management support is needed unless otherwise documented below in the visit note. 

## 2014-04-28 NOTE — Progress Notes (Signed)
  Subjective:     Elmer Sowrellis Bennison is a 49 y.o. female who presents for evaluation of neck pain. Event that precipitated these symptoms: twisting injury while turning head in shower. Onset of symptoms was 3 days ago, and have been gradually worsening since that time. Current symptoms are numbness in r arm --pins and needles and weakness in r arm. Patient denies other symptoms. Patient has had similar problem in past that resolved with PT and meds. Previous treatments: none.  The following portions of the patient's history were reviewed and updated as appropriate: allergies, current medications, past family history, past medical history, past social history, past surgical history and problem list.  Review of Systems Pertinent items are noted in HPI.    Objective:    BP 118/72 mmHg  Pulse 75  Temp(Src) 98.3 F (36.8 C) (Oral)  Wt 169 lb 3.2 oz (76.749 kg)  SpO2 97% General:   alert, cooperative, appears stated age and no distress  External Deformity:  absent  ROM Cervical Spine:  normal range of motion, flexion to 25 degrees, extension to 10 degrees, left rotation to 25 degrees, right rotation to 25 degrees, left lateral bending to 25 degrees and right lateral bending to 25 degrees  Midline Tenderness:  absent midline  Paraspinous tenderness:  moderate R side  UE Neurologic Exam:  normal strength, normal sensation, normal reflexes   X-ray of the cervical spine: Not indicated    Assessment:    Cervical strain    Plan:    Agricultural engineerducational material distributed. Discussed appropriate exercises. Discussed appropriate use of ice and heat. OTC analgesics as needed. Muscle relaxants added per medication orders. Follow up in  2 weeks.

## 2014-04-28 NOTE — Patient Instructions (Signed)

## 2014-07-23 ENCOUNTER — Other Ambulatory Visit: Payer: Self-pay | Admitting: Family Medicine

## 2014-11-18 IMAGING — CT CT HEAD W/O CM
1 series · 16 of 30 positions shown, 20 images · non-contrast
Comparison: None.

CLINICAL DATA: Dizziness and headache with history of hypertension

EXAM:
CT HEAD WITHOUT CONTRAST
TECHNIQUE: Contiguous axial images were obtained from the base of the skull
through the vertex without intravenous contrast.

[Series 2: head 4.8 h37s · axial · 0.45mm/px · z∈[+1148,+1285]mm · 16 of 32 slices shown, 20 images]
[im 2/32  brain]
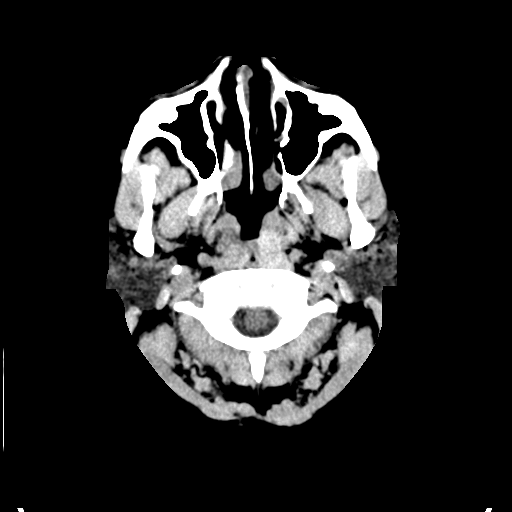
[im 2/32  bone]
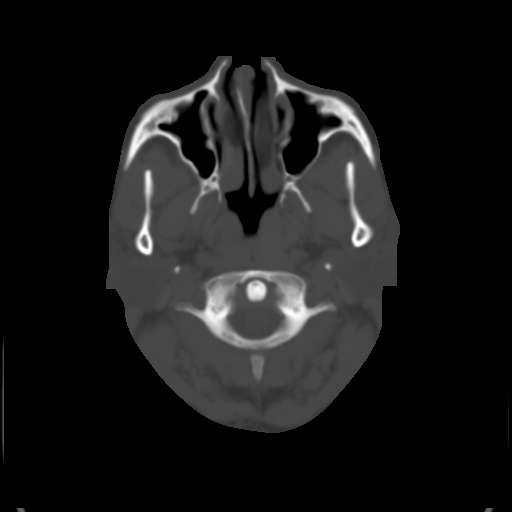
[im 4/32  brain]
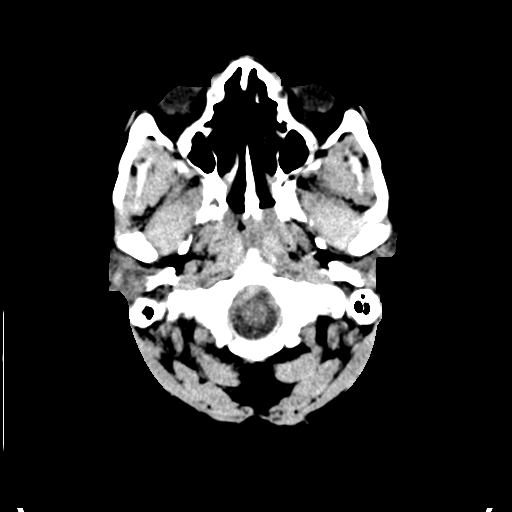
[im 6/32  brain]
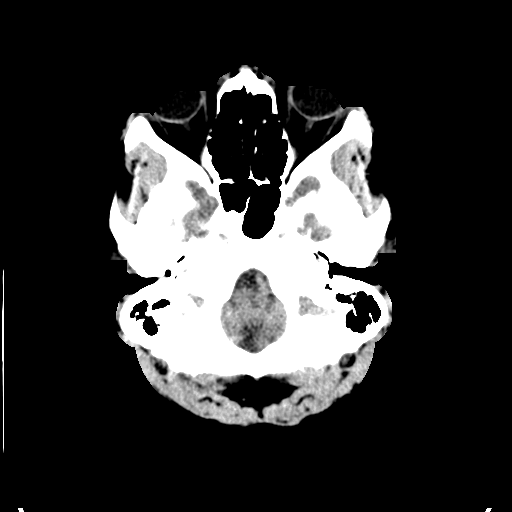
[im 8/32  brain]
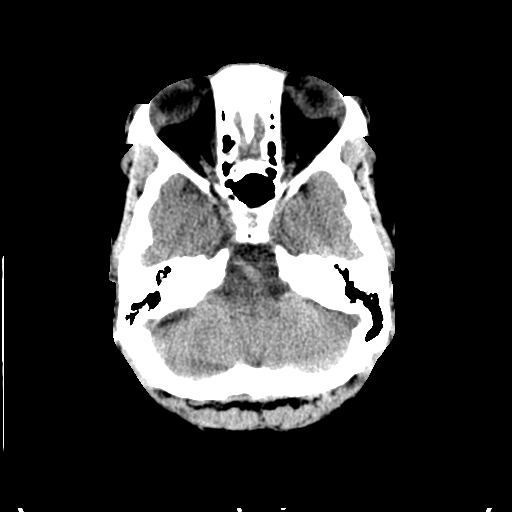
[im 9/32  brain]
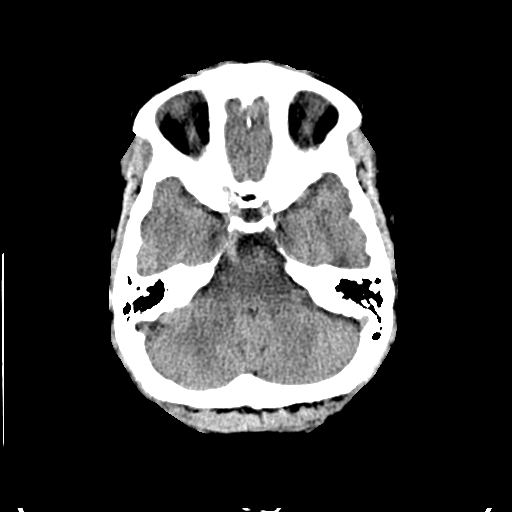
[im 9/32  bone]
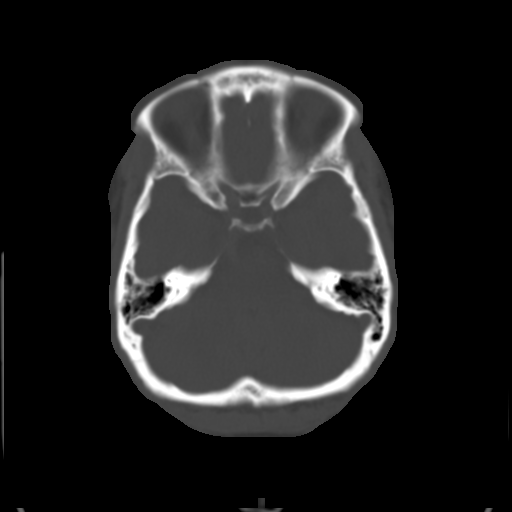
[im 11/32  brain]
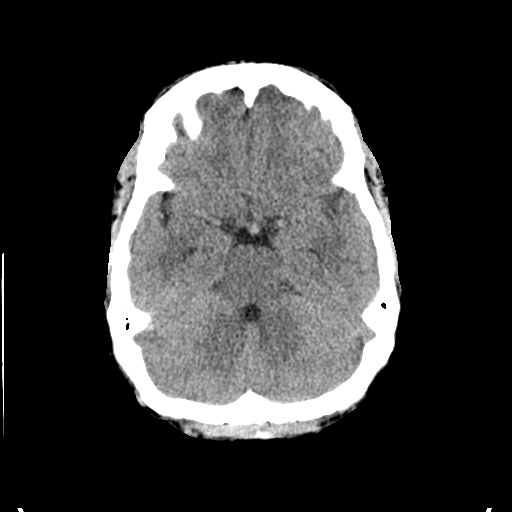
[im 13/32  brain]
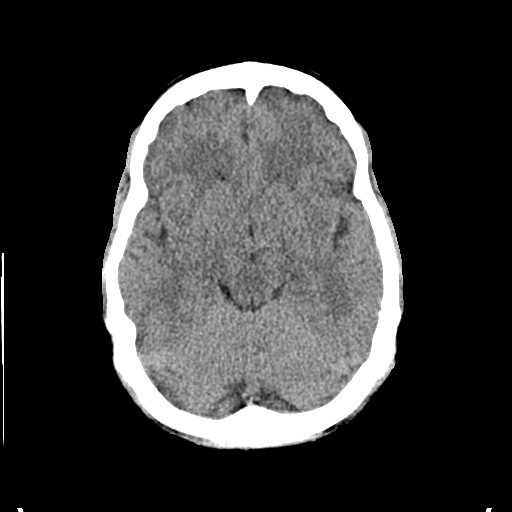
[im 15/32  brain]
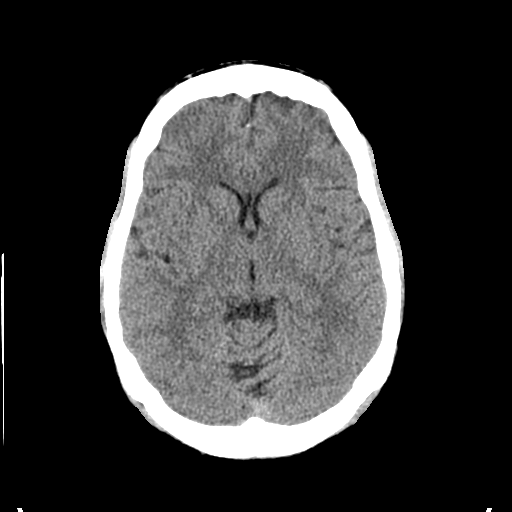
[im 17/32  brain]
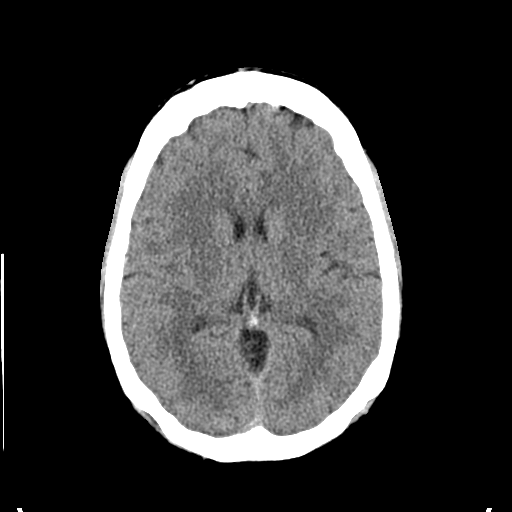
[im 17/32  bone]
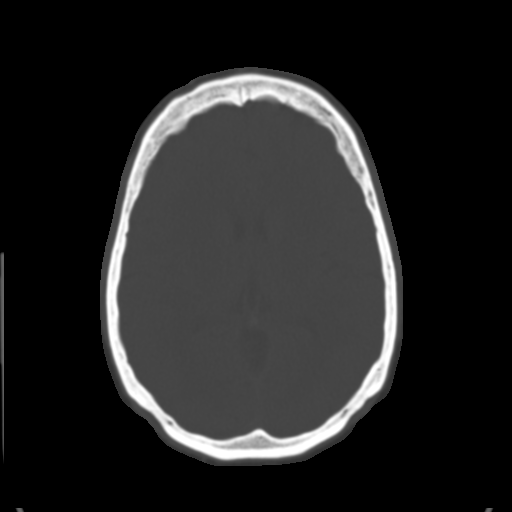
[im 19/32  brain]
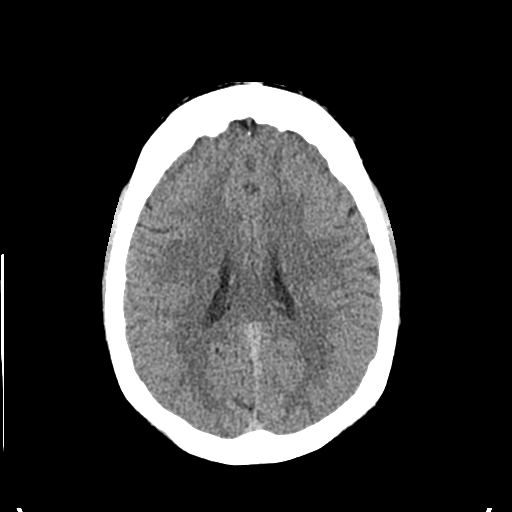
[im 21/32  brain]
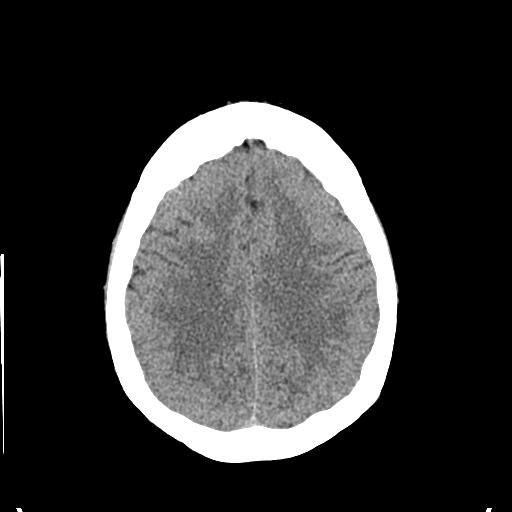
[im 23/32  brain]
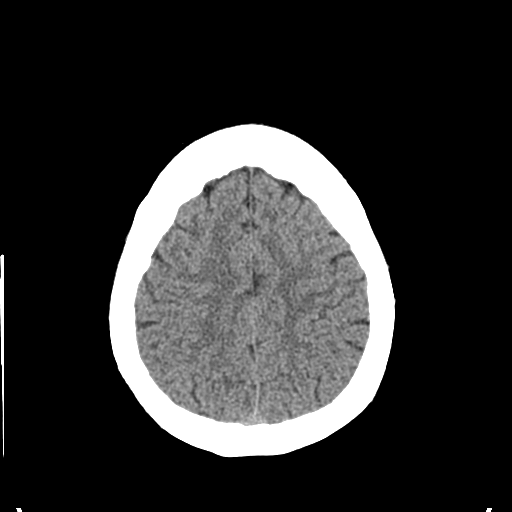
[im 24/32  brain]
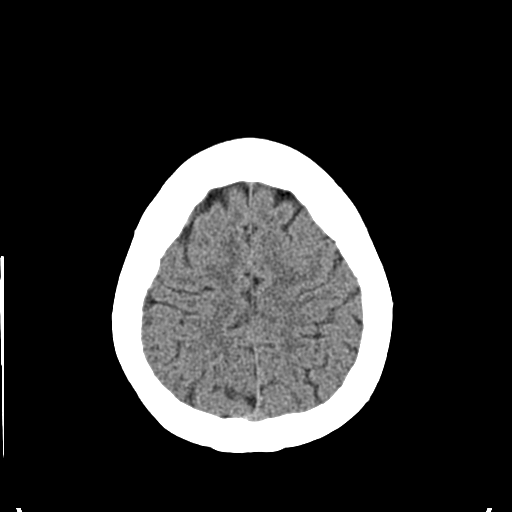
[im 24/32  bone]
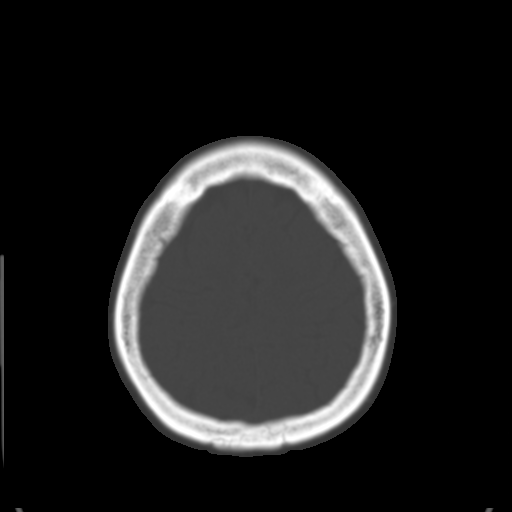
[im 26/32  brain]
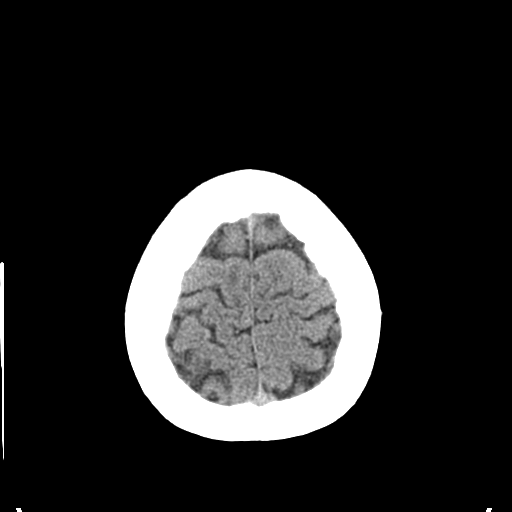
[im 28/32  brain]
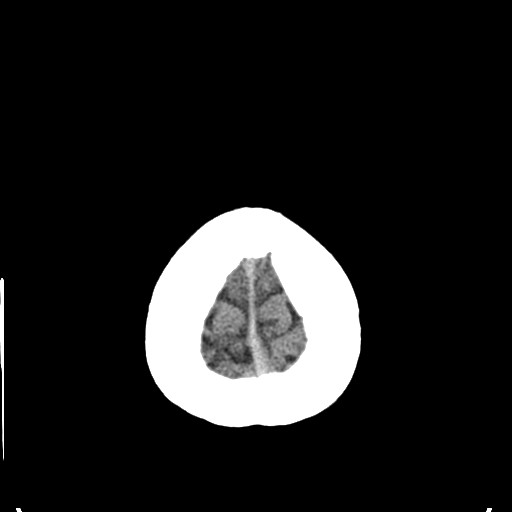
[im 30/32  brain]
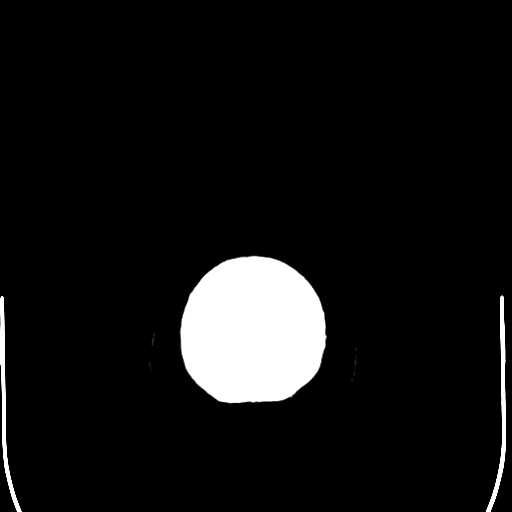

[16 of 30 positions shown; findings below may reference images not displayed]

FINDINGS: The ventricles are normal in size and position. There is no
intracranial hemorrhage nor intracranial mass effect. There is no
evidence of an evolving ischemic infarction. The cerebellum and
brainstem are normal in density.

At bone window settings the observed portions of the paranasal
sinuses and mastoid air cells are clear. There is no evidence of an
acute skull fracture.
IMPRESSION: There is no evidence of an acute ischemic or hemorrhagic event or
other acute intracranial abnormality.

## 2014-11-18 IMAGING — CR DG CHEST 2V
2 series · 2 of 2 positions shown · non-contrast
Comparison: None.

CLINICAL DATA: Chest pain

EXAM:
CHEST  2 VIEW

[w chest pa]
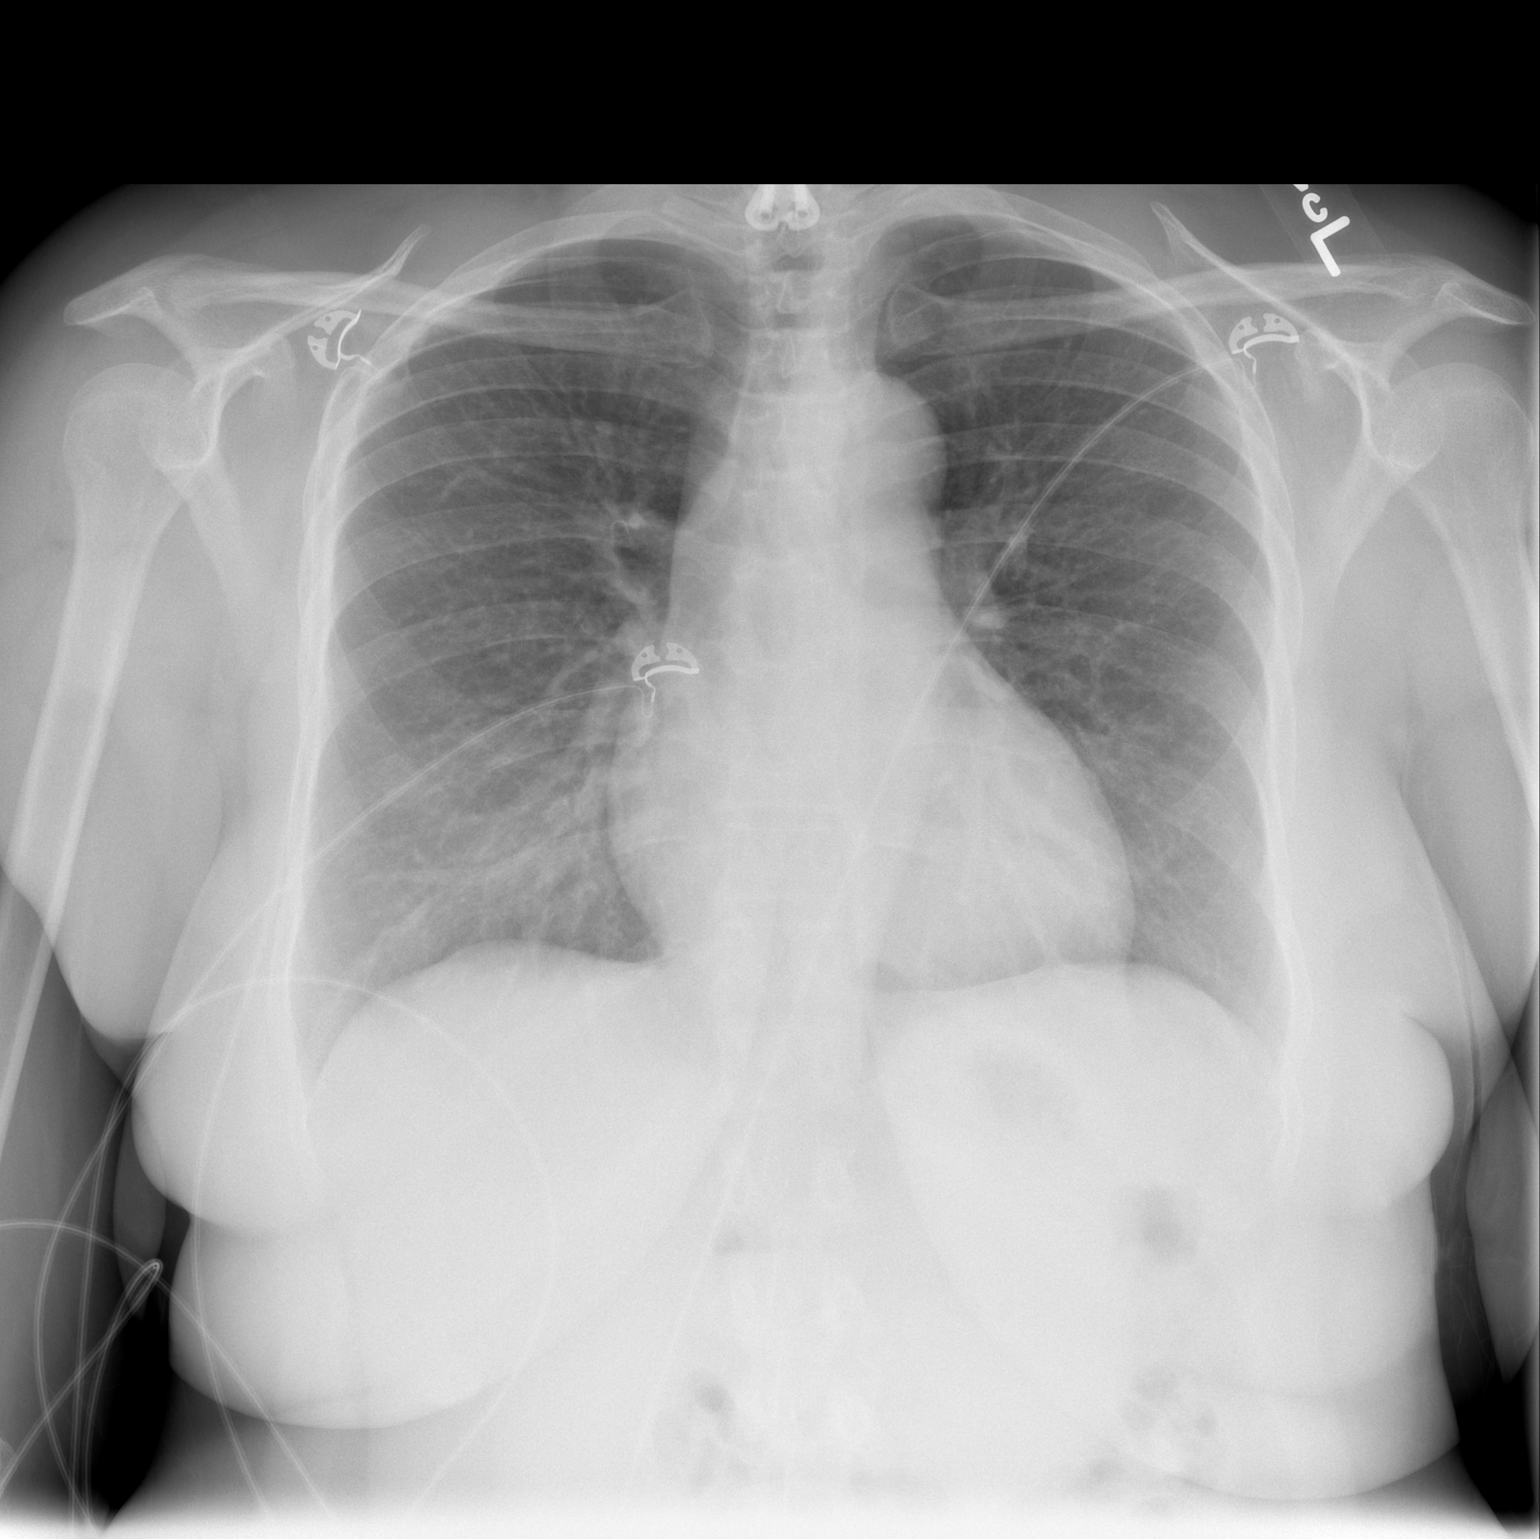

[w chest lat]
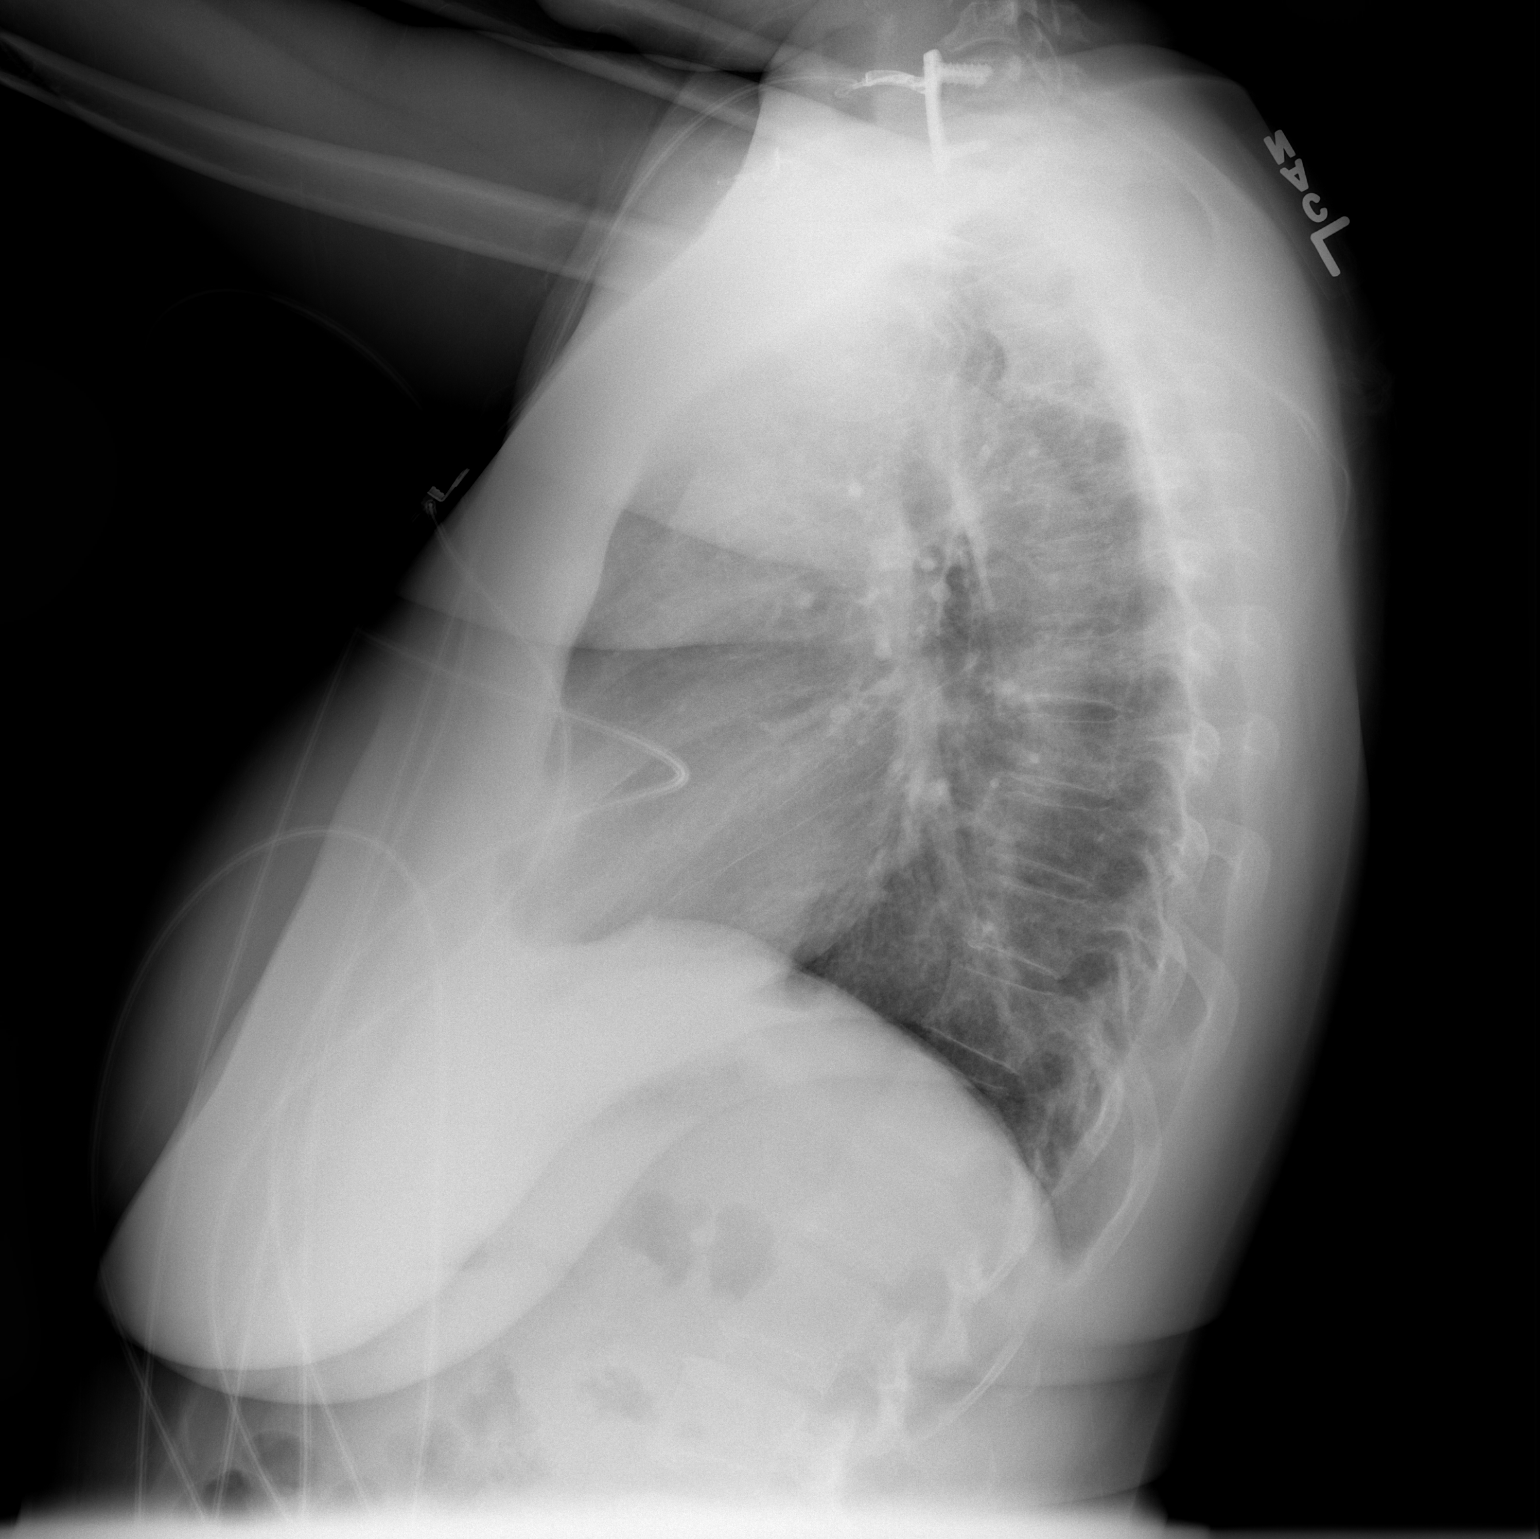

[2 of 2 positions shown; findings below may reference images not displayed]

FINDINGS: The heart size and mediastinal contours are within normal limits.
Both lungs are clear. The visualized skeletal structures are
unremarkable.
IMPRESSION: No active cardiopulmonary disease.

## 2015-02-05 ENCOUNTER — Other Ambulatory Visit: Payer: Self-pay | Admitting: Family Medicine

## 2015-05-09 ENCOUNTER — Other Ambulatory Visit: Payer: Self-pay | Admitting: Family Medicine

## 2015-05-10 NOTE — Telephone Encounter (Signed)
Scheduled patient to come in Thursday 4/6

## 2015-05-10 NOTE — Telephone Encounter (Signed)
Please schedule this patient an apt with Dr.Lowne.  BP follow up.  KP

## 2015-05-13 ENCOUNTER — Ambulatory Visit: Payer: Medicaid Other | Admitting: Family Medicine

## 2015-05-13 ENCOUNTER — Telehealth: Payer: Self-pay | Admitting: Family Medicine

## 2015-05-13 DIAGNOSIS — Z0289 Encounter for other administrative examinations: Secondary | ICD-10-CM

## 2015-05-14 NOTE — Telephone Encounter (Signed)
Pt was no show 05/13/15 4:15pm for follow up appt, 1st visit in over a year, pt has not rescheduled, charge or no charge?

## 2015-05-14 NOTE — Telephone Encounter (Signed)
charge 

## 2015-05-17 ENCOUNTER — Encounter: Payer: Self-pay | Admitting: Family Medicine

## 2015-05-17 NOTE — Telephone Encounter (Signed)
Marked to charge and mailing no show letter °

## 2015-08-04 ENCOUNTER — Encounter (HOSPITAL_BASED_OUTPATIENT_CLINIC_OR_DEPARTMENT_OTHER): Payer: Self-pay | Admitting: *Deleted

## 2015-08-04 ENCOUNTER — Emergency Department (HOSPITAL_BASED_OUTPATIENT_CLINIC_OR_DEPARTMENT_OTHER)
Admission: EM | Admit: 2015-08-04 | Discharge: 2015-08-04 | Disposition: A | Discharge status: Home / Self Care | Payer: Medicaid Other | Attending: Emergency Medicine | Admitting: Emergency Medicine

## 2015-08-04 DIAGNOSIS — R079 Chest pain, unspecified: Secondary | ICD-10-CM | POA: Diagnosis not present

## 2015-08-04 DIAGNOSIS — R600 Localized edema: Secondary | ICD-10-CM | POA: Diagnosis not present

## 2015-08-04 DIAGNOSIS — R609 Edema, unspecified: Secondary | ICD-10-CM

## 2015-08-04 DIAGNOSIS — Z789 Other specified health status: Secondary | ICD-10-CM

## 2015-08-04 DIAGNOSIS — Z9114 Patient's other noncompliance with medication regimen: Secondary | ICD-10-CM | POA: Diagnosis not present

## 2015-08-04 DIAGNOSIS — Z79899 Other long term (current) drug therapy: Secondary | ICD-10-CM | POA: Insufficient documentation

## 2015-08-04 DIAGNOSIS — F17210 Nicotine dependence, cigarettes, uncomplicated: Secondary | ICD-10-CM | POA: Diagnosis not present

## 2015-08-04 DIAGNOSIS — I10 Essential (primary) hypertension: Secondary | ICD-10-CM

## 2015-08-04 DIAGNOSIS — M7989 Other specified soft tissue disorders: Secondary | ICD-10-CM | POA: Diagnosis present

## 2015-08-04 DIAGNOSIS — F329 Major depressive disorder, single episode, unspecified: Secondary | ICD-10-CM | POA: Insufficient documentation

## 2015-08-04 MED ORDER — LISINOPRIL-HYDROCHLOROTHIAZIDE 20-25 MG PO TABS: 1.0000 | ORAL_TABLET | Freq: Every day | ORAL | Status: DC
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
## 2015-08-04 NOTE — Discharge Instructions (Signed)
Edema Edema is an abnormal buildup of fluids. It is more common in your legs and thighs. Painless swelling of the feet and ankles is more likely as a person ages. It also is common in looser skin, like around your eyes. HOME CARE   Keep the affected body part above the level of the heart while lying down.  Do not sit still or stand for a long time.  Do not put anything right under your knees when you lie down.  Do not wear tight clothes on your upper legs.  Exercise your legs to help the puffiness (swelling) go down.  Wear elastic bandages or support stockings as told by your doctor.  A low-salt diet may help lessen the puffiness.  Only take medicine as told by your doctor. GET HELP IF:  Treatment is not working.  You have heart, liver, or kidney disease and notice that your skin looks puffy or shiny.  You have puffiness in your legs that does not get better when you raise your legs.  You have sudden weight gain for no reason. GET HELP RIGHT AWAY IF:   You have shortness of breath or chest pain.  You cannot breathe when you lie down.  You have pain, redness, or warmth in the areas that are puffy.  You have heart, liver, or kidney disease and get edema all of a sudden.  You have a fever and your symptoms get worse all of a sudden. MAKE SURE YOU:   Understand these instructions.  Will watch your condition.  Will get help right away if you are not doing well or get worse.  Hypertension Hypertension, commonly called high blood pressure, is when the force of blood pumping through your arteries is too strong. Your arteries are the blood vessels that carry blood from your heart throughout your body. A blood pressure reading consists of a higher number over a lower number, such as 110/72. The higher number (systolic) is the pressure inside your arteries when your heart pumps. The lower number (diastolic) is the pressure inside your arteries when your heart relaxes. Ideally you  want your blood pressure below 120/80. Hypertension forces your heart to work harder to pump blood. Your arteries may become narrow or stiff. Having untreated or uncontrolled hypertension can cause heart attack, stroke, kidney disease, and other problems. RISK FACTORS Some risk factors for high blood pressure are controllable. Others are not.  Risk factors you cannot control include:   Race. You may be at higher risk if you are African American.  Age. Risk increases with age.  Gender. Men are at higher risk than women before age 52 years. After age 665, women are at higher risk than men. Risk factors you can control include:  Not getting enough exercise or physical activity.  Being overweight.  Getting too much fat, sugar, calories, or salt in your diet.  Drinking too much alcohol. SIGNS AND SYMPTOMS Hypertension does not usually cause signs or symptoms. Extremely high blood pressure (hypertensive crisis) may cause headache, anxiety, shortness of breath, and nosebleed. DIAGNOSIS To check if you have hypertension, your health care provider will measure your blood pressure while you are seated, with your arm held at the level of your heart. It should be measured at least twice using the same arm. Certain conditions can cause a difference in blood pressure between your right and left arms. A blood pressure reading that is higher than normal on one occasion does not mean that you need treatment. If  it is not clear whether you have high blood pressure, you may be asked to return on a different day to have your blood pressure checked again. Or, you may be asked to monitor your blood pressure at home for 1 or more weeks. TREATMENT Treating high blood pressure includes making lifestyle changes and possibly taking medicine. Living a healthy lifestyle can help lower high blood pressure. You may need to change some of your habits. Lifestyle changes may include:  Following the DASH diet. This diet is  high in fruits, vegetables, and whole grains. It is low in salt, red meat, and added sugars.  Keep your sodium intake below 2,300 mg per day.  Getting at least 30-45 minutes of aerobic exercise at least 4 times per week.  Losing weight if necessary.  Not smoking.  Limiting alcoholic beverages.  Learning ways to reduce stress. Your health care provider may prescribe medicine if lifestyle changes are not enough to get your blood pressure under control, and if one of the following is true:  You are 1918-52 years of age and your systolic blood pressure is above 140.  You are 52 years of age or older, and your systolic blood pressure is above 150.  Your diastolic blood pressure is above 90.  You have diabetes, and your systolic blood pressure is over 140 or your diastolic blood pressure is over 90.  You have kidney disease and your blood pressure is above 140/90.  You have heart disease and your blood pressure is above 140/90. Your personal target blood pressure may vary depending on your medical conditions, your age, and other factors. HOME CARE INSTRUCTIONS  Have your blood pressure rechecked as directed by your health care provider.   Take medicines only as directed by your health care provider. Follow the directions carefully. Blood pressure medicines must be taken as prescribed. The medicine does not work as well when you skip doses. Skipping doses also puts you at risk for problems.  Do not smoke.   Monitor your blood pressure at home as directed by your health care provider. SEEK MEDICAL CARE IF:   You think you are having a reaction to medicines taken.  You have recurrent headaches or feel dizzy.  You have swelling in your ankles.  You have trouble with your vision. SEEK IMMEDIATE MEDICAL CARE IF:  You develop a severe headache or confusion.  You have unusual weakness, numbness, or feel faint.  You have severe chest or abdominal pain.  You vomit  repeatedly.  You have trouble breathing. MAKE SURE YOU:   Understand these instructions.  Will watch your condition.  Will get help right away if you are not doing well or get worse.   This information is not intended to replace advice given to you by your health care provider. Make sure you discuss any questions you have with your health care provider.   Document Released: 01/23/2005 Document Revised: 06/09/2014 Document Reviewed: 11/15/2012 Elsevier Interactive Patient Education [employer]2016 Elsevier Inc.

## 2015-08-04 NOTE — ED Notes (Signed)
Pt ambulatory from triage with steady gait 

## 2015-08-04 NOTE — ED Notes (Signed)
Pt c/o lower leg swelling x 3 days, out of bp meds x 1 week

## 2015-09-01 ENCOUNTER — Telehealth: Payer: Self-pay | Admitting: Family Medicine

## 2015-09-01 NOTE — Telephone Encounter (Signed)
Please call and offer this patient an apt.    KP

## 2015-09-07 NOTE — Telephone Encounter (Signed)
Patient states since her insurance changed to medicaid she was advised where not in network & appreciate PCP refilling medication.

## 2015-09-08 NOTE — Telephone Encounter (Signed)
Please advise on the Medicaid Protocol and whether this patient can be seen here or not and if she can, then she needs an appointment. Thanks     KP

## 2015-09-08 NOTE — Telephone Encounter (Signed)
Patient scheduled for 09/23/2015

## 2015-09-08 NOTE — Telephone Encounter (Signed)
Patient can be seen in this office since she was already established before her insurance change. Pleas call patient and schedule her a medication follow up with the provider

## 2015-09-13 ENCOUNTER — Other Ambulatory Visit: Payer: Self-pay | Admitting: Family Medicine

## 2015-09-23 ENCOUNTER — Ambulatory Visit: Payer: Medicaid Other | Admitting: Family Medicine

## 2015-10-05 ENCOUNTER — Encounter: Payer: Self-pay | Admitting: Family Medicine

## 2015-10-05 ENCOUNTER — Ambulatory Visit (INDEPENDENT_AMBULATORY_CARE_PROVIDER_SITE_OTHER): Payer: Medicaid Other | Admitting: Family Medicine

## 2015-10-05 VITALS — BP 130/90 | HR 84 | Temp 98.2°F | Ht 61.0 in | Wt 177.8 lb

## 2015-10-05 DIAGNOSIS — H538 Other visual disturbances: Secondary | ICD-10-CM

## 2015-10-05 DIAGNOSIS — I10 Essential (primary) hypertension: Secondary | ICD-10-CM

## 2015-10-05 MED ORDER — LISINOPRIL-HYDROCHLOROTHIAZIDE 20-25 MG PO TABS: ORAL_TABLET | ORAL | 1 refills | Status: DC
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
## 2015-10-05 NOTE — Patient Instructions (Signed)
Hypertension Hypertension, commonly called high blood pressure, is when the force of blood pumping through your arteries is too strong. Your arteries are the blood vessels that carry blood from your heart throughout your body. A blood pressure reading consists of a higher number over a lower number, such as 110/72. The higher number (systolic) is the pressure inside your arteries when your heart pumps. The lower number (diastolic) is the pressure inside your arteries when your heart relaxes. Ideally you want your blood pressure below 120/80. Hypertension forces your heart to work harder to pump blood. Your arteries may become narrow or stiff. Having untreated or uncontrolled hypertension can cause heart attack, stroke, kidney disease, and other problems. RISK FACTORS Some risk factors for high blood pressure are controllable. Others are not.  Risk factors you cannot control include:   Race. You may be at higher risk if you are African American.  Age. Risk increases with age.  Gender. Men are at higher risk than women before age 45 years. After age 65, women are at higher risk than men. Risk factors you can control include:  Not getting enough exercise or physical activity.  Being overweight.  Getting too much fat, sugar, calories, or salt in your diet.  Drinking too much alcohol. SIGNS AND SYMPTOMS Hypertension does not usually cause signs or symptoms. Extremely high blood pressure (hypertensive crisis) may cause headache, anxiety, shortness of breath, and nosebleed. DIAGNOSIS To check if you have hypertension, your health care provider will measure your blood pressure while you are seated, with your arm held at the level of your heart. It should be measured at least twice using the same arm. Certain conditions can cause a difference in blood pressure between your right and left arms. A blood pressure reading that is higher than normal on one occasion does not mean that you need treatment. If  it is not clear whether you have high blood pressure, you may be asked to return on a different day to have your blood pressure checked again. Or, you may be asked to monitor your blood pressure at home for 1 or more weeks. TREATMENT Treating high blood pressure includes making lifestyle changes and possibly taking medicine. Living a healthy lifestyle can help lower high blood pressure. You may need to change some of your habits. Lifestyle changes may include:  Following the DASH diet. This diet is high in fruits, vegetables, and whole grains. It is low in salt, red meat, and added sugars.  Keep your sodium intake below 2,300 mg per day.  Getting at least 30-45 minutes of aerobic exercise at least 4 times per week.  Losing weight if necessary.  Not smoking.  Limiting alcoholic beverages.  Learning ways to reduce stress. Your health care provider may prescribe medicine if lifestyle changes are not enough to get your blood pressure under control, and if one of the following is true:  You are 18-59 years of age and your systolic blood pressure is above 140.  You are 60 years of age or older, and your systolic blood pressure is above 150.  Your diastolic blood pressure is above 90.  You have diabetes, and your systolic blood pressure is over 140 or your diastolic blood pressure is over 90.  You have kidney disease and your blood pressure is above 140/90.  You have heart disease and your blood pressure is above 140/90. Your personal target blood pressure may vary depending on your medical conditions, your age, and other factors. HOME CARE INSTRUCTIONS    Have your blood pressure rechecked as directed by your health care provider.   Take medicines only as directed by your health care provider. Follow the directions carefully. Blood pressure medicines must be taken as prescribed. The medicine does not work as well when you skip doses. Skipping doses also puts you at risk for  problems.  Do not smoke.   Monitor your blood pressure at home as directed by your health care provider. SEEK MEDICAL CARE IF:   You think you are having a reaction to medicines taken.  You have recurrent headaches or feel dizzy.  You have swelling in your ankles.  You have trouble with your vision. SEEK IMMEDIATE MEDICAL CARE IF:  You develop a severe headache or confusion.  You have unusual weakness, numbness, or feel faint.  You have severe chest or abdominal pain.  You vomit repeatedly.  You have trouble breathing. MAKE SURE YOU:   Understand these instructions.  Will watch your condition.  Will get help right away if you are not doing well or get worse.   This information is not intended to replace advice given to you by your health care provider. Make sure you discuss any questions you have with your health care provider.   Document Released: 01/23/2005 Document Revised: 06/09/2014 Document Reviewed: 11/15/2012 Elsevier Interactive Patient Education 2016 [employer]  

## 2015-10-05 NOTE — Progress Notes (Signed)
Pre visit review using our clinic review tool, if applicable. No additional management support is needed unless otherwise documented below in the visit note. 

## 2015-10-06 ENCOUNTER — Other Ambulatory Visit (INDEPENDENT_AMBULATORY_CARE_PROVIDER_SITE_OTHER): Payer: Medicaid Other

## 2015-10-06 DIAGNOSIS — I10 Essential (primary) hypertension: Secondary | ICD-10-CM

## 2015-10-06 LAB — CBC WITH DIFFERENTIAL/PLATELET
Basophils Absolute: 0 10*3/uL (ref 0.0–0.1)
Basophils Relative: 0.4 % (ref 0.0–3.0)
EOS ABS: 0.2 10*3/uL (ref 0.0–0.7)
Eosinophils Relative: 2.2 % (ref 0.0–5.0)
HCT: 35.2 % — ABNORMAL LOW (ref 36.0–46.0)
HEMOGLOBIN: 11.9 g/dL — AB (ref 12.0–15.0)
Lymphocytes Relative: 46.5 % — ABNORMAL HIGH (ref 12.0–46.0)
Lymphs Abs: 4.6 10*3/uL — ABNORMAL HIGH (ref 0.7–4.0)
MCHC: 33.9 g/dL (ref 30.0–36.0)
MCV: 87.3 fl (ref 78.0–100.0)
MONO ABS: 0.6 10*3/uL (ref 0.1–1.0)
Monocytes Relative: 6.5 % (ref 3.0–12.0)
Neutro Abs: 4.4 10*3/uL (ref 1.4–7.7)
Neutrophils Relative %: 44.4 % (ref 43.0–77.0)
Platelets: 328 10*3/uL (ref 150.0–400.0)
RBC: 4.03 Mil/uL (ref 3.87–5.11)
RDW: 15.7 % — AB (ref 11.5–15.5)
WBC: 9.8 10*3/uL (ref 4.0–10.5)

## 2015-10-06 LAB — COMPREHENSIVE METABOLIC PANEL
ALBUMIN: 3.7 g/dL (ref 3.5–5.2)
ALT: 12 U/L (ref 0–35)
AST: 10 U/L (ref 0–37)
Alkaline Phosphatase: 43 U/L (ref 39–117)
BUN: 14 mg/dL (ref 6–23)
CHLORIDE: 107 meq/L (ref 96–112)
CO2: 28 mEq/L (ref 19–32)
CREATININE: 1 mg/dL (ref 0.40–1.20)
Calcium: 8.9 mg/dL (ref 8.4–10.5)
GFR: 74.96 mL/min (ref >60.00)
Glucose, Bld: 107 mg/dL — ABNORMAL HIGH (ref 70–99)
Potassium: 3.7 mEq/L (ref 3.5–5.1)
SODIUM: 140 meq/L (ref 135–145)
TOTAL PROTEIN: 6.9 g/dL (ref 6.0–8.3)
Total Bilirubin: 0.2 mg/dL (ref 0.2–1.2)

## 2015-10-06 LAB — LIPID PANEL
CHOL/HDL RATIO: 5
Cholesterol: 157 mg/dL (ref 0–200)
HDL: 32.6 mg/dL — ABNORMAL LOW (ref >39.00)
LDL CALC: 93 mg/dL (ref 0–99)
NONHDL: 124.38
Triglycerides: 155 mg/dL — ABNORMAL HIGH (ref 0.0–149.0)
VLDL: 31 mg/dL (ref 0.0–40.0)

## 2015-10-06 LAB — POCT URINALYSIS DIPSTICK
BILIRUBIN UA: NEGATIVE
Blood, UA: NEGATIVE
GLUCOSE UA: NEGATIVE
Ketones, UA: NEGATIVE
LEUKOCYTES UA: NEGATIVE
NITRITE UA: NEGATIVE
Protein, UA: NEGATIVE
Spec Grav, UA: >=1.03
Urobilinogen, UA: 0.2
pH, UA: 5.5

## 2015-10-06 NOTE — Assessment & Plan Note (Addendum)
Slightly elevated today but pt has not taken meds today....con't meds

## 2016-01-10 ENCOUNTER — Encounter: Payer: Medicaid Other | Admitting: Family Medicine

## 2016-03-10 ENCOUNTER — Encounter: Payer: Medicaid Other | Admitting: Family Medicine

## 2016-03-30 ENCOUNTER — Other Ambulatory Visit: Payer: Self-pay | Admitting: Family Medicine

## 2016-03-30 DIAGNOSIS — I10 Essential (primary) hypertension: Secondary | ICD-10-CM

## 2016-04-13 ENCOUNTER — Encounter: Payer: Medicaid Other | Admitting: Family Medicine

## 2016-06-05 ENCOUNTER — Encounter: Payer: Self-pay | Admitting: Family Medicine

## 2016-06-05 ENCOUNTER — Ambulatory Visit (INDEPENDENT_AMBULATORY_CARE_PROVIDER_SITE_OTHER): Payer: Medicaid Other | Admitting: Family Medicine

## 2016-06-05 VITALS — BP 113/77 | HR 70 | Temp 98.5°F | Resp 16 | Ht 62.0 in | Wt 174.6 lb

## 2016-06-05 DIAGNOSIS — I10 Essential (primary) hypertension: Secondary | ICD-10-CM | POA: Diagnosis not present

## 2016-06-05 DIAGNOSIS — L739 Follicular disorder, unspecified: Secondary | ICD-10-CM | POA: Diagnosis not present

## 2016-06-05 DIAGNOSIS — M25511 Pain in right shoulder: Secondary | ICD-10-CM

## 2016-06-05 DIAGNOSIS — Z0000 Encounter for general adult medical examination without abnormal findings: Secondary | ICD-10-CM | POA: Diagnosis not present

## 2016-06-05 DIAGNOSIS — Z1159 Encounter for screening for other viral diseases: Secondary | ICD-10-CM

## 2016-06-05 DIAGNOSIS — Z1231 Encounter for screening mammogram for malignant neoplasm of breast: Secondary | ICD-10-CM | POA: Diagnosis not present

## 2016-06-05 DIAGNOSIS — Z1239 Encounter for other screening for malignant neoplasm of breast: Secondary | ICD-10-CM

## 2016-06-05 LAB — LIPID PANEL
CHOL/HDL RATIO: 6
CHOLESTEROL: 186 mg/dL (ref 0–200)
HDL: 33.7 mg/dL — ABNORMAL LOW (ref >39.00)
LDL CALC: 118 mg/dL — AB (ref 0–99)
NONHDL: 152.34
Triglycerides: 171 mg/dL — ABNORMAL HIGH (ref 0.0–149.0)
VLDL: 34.2 mg/dL (ref 0.0–40.0)

## 2016-06-05 LAB — TSH: TSH: 0.96 u[IU]/mL (ref 0.35–4.50)

## 2016-06-05 LAB — POC URINALSYSI DIPSTICK (AUTOMATED)
BILIRUBIN UA: NEGATIVE
Blood, UA: NEGATIVE
Glucose, UA: NEGATIVE
Ketones, UA: NEGATIVE
LEUKOCYTES UA: NEGATIVE
NITRITE UA: NEGATIVE
PH UA: 6 (ref 5.0–8.0)
PROTEIN UA: NEGATIVE
Spec Grav, UA: >=1.03 — AB (ref 1.010–1.025)
Urobilinogen, UA: 0.2 E.U./dL

## 2016-06-05 LAB — CBC WITH DIFFERENTIAL/PLATELET
BASOS ABS: 0 10*3/uL (ref 0.0–0.1)
Basophils Relative: 0.3 % (ref 0.0–3.0)
EOS ABS: 0.3 10*3/uL (ref 0.0–0.7)
Eosinophils Relative: 2.6 % (ref 0.0–5.0)
HCT: 37.2 % (ref 36.0–46.0)
Hemoglobin: 12.4 g/dL (ref 12.0–15.0)
Lymphocytes Relative: 57.1 % — ABNORMAL HIGH (ref 12.0–46.0)
Lymphs Abs: 6.2 10*3/uL — ABNORMAL HIGH (ref 0.7–4.0)
MCHC: 33.4 g/dL (ref 30.0–36.0)
MCV: 88.1 fl (ref 78.0–100.0)
MONO ABS: 0.6 10*3/uL (ref 0.1–1.0)
Monocytes Relative: 5.9 % (ref 3.0–12.0)
Neutro Abs: 3.7 10*3/uL (ref 1.4–7.7)
Neutrophils Relative %: 34.1 % — ABNORMAL LOW (ref 43.0–77.0)
Platelets: 307 10*3/uL (ref 150.0–400.0)
RBC: 4.22 Mil/uL (ref 3.87–5.11)
RDW: 15.4 % (ref 11.5–15.5)
WBC: 10.8 10*3/uL — AB (ref 4.0–10.5)

## 2016-06-05 LAB — COMPREHENSIVE METABOLIC PANEL
ALBUMIN: 4.2 g/dL (ref 3.5–5.2)
ALT: 15 U/L (ref 0–35)
AST: 13 U/L (ref 0–37)
Alkaline Phosphatase: 47 U/L (ref 39–117)
BUN: 21 mg/dL (ref 6–23)
CHLORIDE: 107 meq/L (ref 96–112)
CO2: 30 meq/L (ref 19–32)
CREATININE: 1.02 mg/dL (ref 0.40–1.20)
Calcium: 9.6 mg/dL (ref 8.4–10.5)
GFR: 73.08 mL/min (ref >60.00)
Glucose, Bld: 100 mg/dL — ABNORMAL HIGH (ref 70–99)
Potassium: 3.2 mEq/L — ABNORMAL LOW (ref 3.5–5.1)
SODIUM: 140 meq/L (ref 135–145)
Total Bilirubin: 0.3 mg/dL (ref 0.2–1.2)
Total Protein: 7.6 g/dL (ref 6.0–8.3)

## 2016-06-05 MED ORDER — LISINOPRIL-HYDROCHLOROTHIAZIDE 20-25 MG PO TABS: 1.0000 | ORAL_TABLET | Freq: Every day | ORAL | 1 refills | Status: DC

## 2016-06-05 MED ORDER — CYCLOBENZAPRINE HCL 10 MG PO TABS: 10.0000 mg | ORAL_TABLET | Freq: Three times a day (TID) | ORAL | 0 refills | Status: DC | PRN

## 2016-06-05 MED ORDER — DOXYCYCLINE HYCLATE 100 MG PO TABS: 100.0000 mg | ORAL_TABLET | Freq: Two times a day (BID) | ORAL | 2 refills | Status: DC

## 2016-06-05 NOTE — Progress Notes (Signed)
Pre visit review using our clinic review tool, if applicable. No additional management support is needed unless otherwise documented below in the visit note. 
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
## 2016-06-05 NOTE — Patient Instructions (Signed)

## 2016-06-05 NOTE — Assessment & Plan Note (Signed)
Well controlled, no changes to meds. Encouraged heart healthy diet such as the DASH diet and exercise as tolerated.  °

## 2016-06-06 LAB — HEPATITIS C ANTIBODY: HCV Ab: NEGATIVE

## 2016-06-20 ENCOUNTER — Ambulatory Visit (HOSPITAL_BASED_OUTPATIENT_CLINIC_OR_DEPARTMENT_OTHER): Payer: Medicaid Other

## 2016-06-24 ENCOUNTER — Other Ambulatory Visit: Payer: Self-pay | Admitting: Family Medicine

## 2016-06-24 DIAGNOSIS — M25511 Pain in right shoulder: Secondary | ICD-10-CM

## 2016-09-05 ENCOUNTER — Ambulatory Visit: Payer: Medicaid Other | Admitting: Family Medicine

## 2016-09-05 DIAGNOSIS — Z0289 Encounter for other administrative examinations: Secondary | ICD-10-CM

## 2016-09-17 ENCOUNTER — Other Ambulatory Visit: Payer: Self-pay | Admitting: Family Medicine

## 2017-04-05 ENCOUNTER — Other Ambulatory Visit: Payer: Self-pay | Admitting: Family Medicine

## 2017-04-05 DIAGNOSIS — I10 Essential (primary) hypertension: Secondary | ICD-10-CM

## 2017-06-13 ENCOUNTER — Telehealth: Payer: Self-pay | Admitting: Family Medicine

## 2017-06-13 DIAGNOSIS — I10 Essential (primary) hypertension: Secondary | ICD-10-CM

## 2017-06-13 MED ORDER — LISINOPRIL-HYDROCHLOROTHIAZIDE 20-25 MG PO TABS: 1.0000 | ORAL_TABLET | Freq: Every day | ORAL | 0 refills | Status: DC

## 2017-06-13 NOTE — Telephone Encounter (Signed)
Copied from CRM [id number]. Topic: Quick Communication - Rx Refill/Question >> Jun 13, 2017 10:59 AM Cipriano Bunker wrote: Medication:  lisinopril-hydrochlorothiazide (PRINZIDE,ZESTORETIC) 20-25 MG tablet  Pt. Is out of medication   Has the patient contacted their pharmacy? Yes.   (Agent: If no, request that the patient contact the pharmacy for the refill.) Preferred Pharmacy (with phone number or street name):   [id number] - [place], [place] - [address] [place] [address] Phone: [phone] Fax: [phone]   Agent: Please be advised that RX refills may take up to 3 business days. We ask that you follow-up with your pharmacy.

## 2017-10-27 ENCOUNTER — Other Ambulatory Visit: Payer: Self-pay | Admitting: Family Medicine

## 2017-10-27 DIAGNOSIS — I10 Essential (primary) hypertension: Secondary | ICD-10-CM

## 2017-12-09 ENCOUNTER — Other Ambulatory Visit: Payer: Self-pay | Admitting: Family Medicine

## 2017-12-09 DIAGNOSIS — I10 Essential (primary) hypertension: Secondary | ICD-10-CM

## 2018-01-13 ENCOUNTER — Other Ambulatory Visit: Payer: Self-pay | Admitting: Family Medicine

## 2018-01-13 DIAGNOSIS — I10 Essential (primary) hypertension: Secondary | ICD-10-CM

## 2018-03-09 ENCOUNTER — Other Ambulatory Visit: Payer: Self-pay | Admitting: Family Medicine

## 2018-03-09 DIAGNOSIS — I10 Essential (primary) hypertension: Secondary | ICD-10-CM
